# Patient Record
Sex: Female | Born: 1980 | Race: White | Hispanic: No | Marital: Single | Smoking: Former smoker
Health system: Southern US, Community
[De-identification: ages and names within clinical notes are randomized; demographics above are authoritative.]

## PROBLEM LIST (undated history)

## (undated) DIAGNOSIS — F32A Depression, unspecified: Secondary | ICD-10-CM

## (undated) DIAGNOSIS — F988 Other specified behavioral and emotional disorders with onset usually occurring in childhood and adolescence: Secondary | ICD-10-CM

## (undated) DIAGNOSIS — F4310 Post-traumatic stress disorder, unspecified: Secondary | ICD-10-CM

## (undated) DIAGNOSIS — F329 Major depressive disorder, single episode, unspecified: Secondary | ICD-10-CM

## (undated) DIAGNOSIS — F419 Anxiety disorder, unspecified: Secondary | ICD-10-CM

## (undated) HISTORY — DX: Major depressive disorder, single episode, unspecified: F32.9

## (undated) HISTORY — DX: Depression, unspecified: F32.A

## (undated) HISTORY — DX: Anxiety disorder, unspecified: F41.9

## (undated) HISTORY — DX: Post-traumatic stress disorder, unspecified: F43.10

## (undated) HISTORY — DX: Other specified behavioral and emotional disorders with onset usually occurring in childhood and adolescence: F98.8

---

## 2009-10-23 HISTORY — PX: APPENDECTOMY: SHX54

## 2010-05-22 ENCOUNTER — Observation Stay: Payer: Self-pay | Admitting: Surgery

## 2010-05-24 LAB — PATHOLOGY REPORT

## 2014-10-23 HISTORY — PX: LEEP: SHX91

## 2014-12-22 DIAGNOSIS — A4902 Methicillin resistant Staphylococcus aureus infection, unspecified site: Secondary | ICD-10-CM | POA: Insufficient documentation

## 2015-08-24 LAB — HM PAP SMEAR: HM Pap smear: NEGATIVE

## 2016-02-25 ENCOUNTER — Ambulatory Visit (INDEPENDENT_AMBULATORY_CARE_PROVIDER_SITE_OTHER): Payer: BLUE CROSS/BLUE SHIELD | Admitting: Family Medicine

## 2016-02-25 ENCOUNTER — Encounter: Payer: Self-pay | Admitting: Family Medicine

## 2016-02-25 VITALS — BP 105/63 | HR 63 | Temp 98.9°F | Resp 16 | Ht 61.5 in | Wt 131.4 lb

## 2016-02-25 DIAGNOSIS — F329 Major depressive disorder, single episode, unspecified: Secondary | ICD-10-CM

## 2016-02-25 DIAGNOSIS — Z720 Tobacco use: Secondary | ICD-10-CM | POA: Diagnosis not present

## 2016-02-25 DIAGNOSIS — E785 Hyperlipidemia, unspecified: Secondary | ICD-10-CM | POA: Diagnosis not present

## 2016-02-25 DIAGNOSIS — F4310 Post-traumatic stress disorder, unspecified: Secondary | ICD-10-CM

## 2016-02-25 DIAGNOSIS — Z7189 Other specified counseling: Secondary | ICD-10-CM | POA: Diagnosis not present

## 2016-02-25 DIAGNOSIS — Z7689 Persons encountering health services in other specified circumstances: Secondary | ICD-10-CM

## 2016-02-25 DIAGNOSIS — F32A Depression, unspecified: Secondary | ICD-10-CM

## 2016-02-25 DIAGNOSIS — A4902 Methicillin resistant Staphylococcus aureus infection, unspecified site: Secondary | ICD-10-CM

## 2016-02-25 DIAGNOSIS — F909 Attention-deficit hyperactivity disorder, unspecified type: Secondary | ICD-10-CM

## 2016-02-25 DIAGNOSIS — K900 Celiac disease: Secondary | ICD-10-CM

## 2016-02-25 DIAGNOSIS — F988 Other specified behavioral and emotional disorders with onset usually occurring in childhood and adolescence: Secondary | ICD-10-CM

## 2016-02-25 DIAGNOSIS — K9041 Non-celiac gluten sensitivity: Secondary | ICD-10-CM

## 2016-02-25 MED ORDER — BUPROPION HCL ER (XL) 150 MG PO TB24: 150.0000 mg | ORAL_TABLET | Freq: Every day | ORAL | Status: DC

## 2016-02-25 NOTE — Patient Instructions (Addendum)
Let's start Wellbutrin today. Take  once daily.  I recommend taking at GYN.  We will check some labwork for your depression.     Bupropion extended-release tablets (Depression/Mood Disorders) What is this medicine? BUPROPION (byoo PROE pee on) is used to treat depression. This medicine may be used for other purposes; ask your health care provider or pharmacist if you have questions. What should I tell my health care provider before I take this medicine? They need to know if you have any of these conditions: -an eating disorder, such as anorexia or bulimia -bipolar disorder or psychosis -diabetes or high blood sugar, treated with medication -glaucoma -head injury or brain tumor -heart disease, previous heart attack, or irregular heart beat -high blood pressure -kidney or liver disease -seizures (convulsions) -suicidal thoughts or a previous suicide attempt -Tourette's syndrome -weight loss -an unusual or allergic reaction to bupropion, other medicines, foods, dyes, or preservatives -breast-feeding -pregnant or trying to become pregnant How should I use this medicine? Take this medicine by mouth with a glass of water. Follow the directions on the prescription label. You can take it with or without food. If it upsets your stomach, take it with food. Do not crush, chew, or cut these tablets. This medicine is taken once daily at the same time each day. Do not take your medicine more often than directed. Do not stop taking this medicine suddenly except upon the advice of your doctor. Stopping this medicine too quickly may cause serious side effects or your condition may worsen. A special MedGuide will be given to you by the pharmacist with each prescription and refill. Be sure to read this information carefully each time. Talk to your pediatrician regarding the use of this medicine in children. Special care may be needed. Overdosage: If you think you have taken too much of this medicine  contact a poison control center or emergency room at once. NOTE: This medicine is only for you. Do not share this medicine with others. What if I miss a dose? If you miss a dose, skip the missed dose and take your next tablet at the regular time. Do not take double or extra doses. What may interact with this medicine? Do not take this medicine with any of the following medications: -linezolid -MAOIs like Azilect, Carbex, Eldepryl, Marplan, Nardil, and Parnate -methylene blue (injected into a vein) -other medicines that contain bupropion like Zyban This medicine may also interact with the following medications: -alcohol -certain medicines for anxiety or sleep -certain medicines for blood pressure like metoprolol, propranolol -certain medicines for depression or psychotic disturbances -certain medicines for HIV or AIDS like efavirenz, lopinavir, nelfinavir, ritonavir -certain medicines for irregular heart beat like propafenone, flecainide -certain medicines for Parkinson's disease like amantadine, levodopa -certain medicines for seizures like carbamazepine, phenytoin, phenobarbital -cimetidine -clopidogrel -cyclophosphamide -furazolidone -isoniazid -nicotine -orphenadrine -procarbazine -steroid medicines like prednisone or cortisone -stimulant medicines for attention disorders, weight loss, or to stay awake -tamoxifen -theophylline -thiotepa -ticlopidine -tramadol -warfarin This list may not describe all possible interactions. Give your health care provider a list of all the medicines, herbs, non-prescription drugs, or dietary supplements you use. Also tell them if you smoke, drink alcohol, or use illegal drugs. Some items may interact with your medicine. What should I watch for while using this medicine? Tell your doctor if your symptoms do not get better or if they get worse. Visit your doctor or health care professional for regular checks on your progress. Because it may take  several weeks  to see the full effects of this medicine, it is important to continue your treatment as prescribed by your doctor. Patients and their families should watch out for new or worsening thoughts of suicide or depression. Also watch out for sudden changes in feelings such as feeling anxious, agitated, panicky, irritable, hostile, aggressive, impulsive, severely restless, overly excited and hyperactive, or not being able to sleep. If this happens, especially at the beginning of treatment or after a change in dose, call your health care professional. Avoid alcoholic drinks while taking this medicine. Drinking large amounts of alcoholic beverages, using sleeping or anxiety medicines, or quickly stopping the use of these agents while taking this medicine may increase your risk for a seizure. Do not drive or use heavy machinery until you know how this medicine affects you. This medicine can impair your ability to perform these tasks. Do not take this medicine close to bedtime. It may prevent you from sleeping. Your mouth may get dry. Chewing sugarless gum or sucking hard candy, and drinking plenty of water may help. Contact your doctor if the problem does not go away or is severe. The tablet shell for some brands of this medicine does not dissolve. This is normal. The tablet shell may appear whole in the stool. This is not a cause for concern. What side effects may I notice from receiving this medicine? Side effects that you should report to your doctor or health care professional as soon as possible: -allergic reactions like skin rash, itching or hives, swelling of the face, lips, or tongue -breathing problems -changes in vision -confusion -fast or irregular heartbeat -hallucinations -increased blood pressure -redness, blistering, peeling or loosening of the skin, including inside the mouth -seizures -suicidal thoughts or other mood changes -unusually weak or tired -vomiting Side effects  that usually do not require medical attention (report to your doctor or health care professional if they continue or are bothersome): -change in sex drive or performance -constipation -headache -loss of appetite -nausea -tremors -weight loss This list may not describe all possible side effects. Call your doctor for medical advice about side effects. You may report side effects to FDA at 1-800-FDA-1088. Where should I keep my medicine? Keep out of the reach of children. Store at room temperature between 15 and 30 degrees C (59 and 86 degrees F). Throw away any unused medicine after the expiration date. NOTE: This sheet is a summary. It may not cover all possible information. If you have questions about this medicine, talk to your doctor, pharmacist, or health care provider.    2016, Elsevier/Gold Standard. (2013-05-02 12:39:42)
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
## 2016-02-25 NOTE — Assessment & Plan Note (Addendum)
Diagnosed in HS. Pt currently seeking counseling and treatment with [facility] Does not want medications at this time.

## 2016-02-25 NOTE — Assessment & Plan Note (Signed)
Encouraged smoking cessation. Pt would like to try wellbutrin to help.

## 2016-02-25 NOTE — Assessment & Plan Note (Signed)
Check celiac panel. Refer to GI if symptoms persist. Pt currently eating a gluten free diet.

## 2016-02-25 NOTE — Assessment & Plan Note (Signed)
Currently [facility] with [facility].

## 2016-02-25 NOTE — Assessment & Plan Note (Signed)
Pt seeking counseling at this time. Would like to try Wellbutrin. Start 150mg  XL daily. Plan to increase up to 300mg  daily. Check TSH, B12, and vitamin D. Recheck 4 weeks.

## 2016-02-25 NOTE — Assessment & Plan Note (Signed)
Treated by ID at [facility].

## 2016-03-02 ENCOUNTER — Telehealth: Payer: Self-pay | Admitting: Family Medicine

## 2016-03-02 MED ORDER — ACYCLOVIR 800 MG PO TABS: 800.0000 mg | ORAL_TABLET | Freq: Two times a day (BID) | ORAL | Status: DC

## 2016-03-02 NOTE — Telephone Encounter (Signed)
When pt had her appt with Amy on Friday, she failed to discuss cold sores.  She usually take 800 mg acyclovir when she has a one come up.  Does she need a another appt for that?  Her call back number is [phone]

## 2016-03-02 NOTE — Telephone Encounter (Signed)
No appt needed. I will renew the prescription. Sent and pt alerted.

## 2016-03-07 LAB — TSH: TSH: 1.28 u[IU]/mL (ref 0.450–4.500)

## 2016-03-07 LAB — B12 AND FOLATE PANEL
Folate: 11.9 ng/mL (ref >3.0)
Vitamin B-12: 364 pg/mL (ref 211–946)

## 2016-03-07 LAB — COMPREHENSIVE METABOLIC PANEL
A/G RATIO: 1.9 (ref 1.2–2.2)
ALT: 13 IU/L (ref 0–32)
AST: 20 IU/L (ref 0–40)
Albumin: 4.4 g/dL (ref 3.5–5.5)
Alkaline Phosphatase: 55 IU/L (ref 39–117)
BILIRUBIN TOTAL: 0.3 mg/dL (ref 0.0–1.2)
BUN/Creatinine Ratio: 15 (ref 9–23)
BUN: 13 mg/dL (ref 6–20)
CALCIUM: 9.1 mg/dL (ref 8.7–10.2)
CHLORIDE: 102 mmol/L (ref 96–106)
CO2: 22 mmol/L (ref 18–29)
Creatinine, Ser: 0.84 mg/dL (ref 0.57–1.00)
GFR, EST AFRICAN AMERICAN: 105 mL/min/{1.73_m2} (ref >59)
GFR, EST NON AFRICAN AMERICAN: 91 mL/min/{1.73_m2} (ref >59)
GLOBULIN, TOTAL: 2.3 g/dL (ref 1.5–4.5)
Glucose: 82 mg/dL (ref 65–99)
Potassium: 4.7 mmol/L (ref 3.5–5.2)
SODIUM: 139 mmol/L (ref 134–144)
TOTAL PROTEIN: 6.7 g/dL (ref 6.0–8.5)

## 2016-03-07 LAB — LIPID PANEL
CHOL/HDL RATIO: 2.8 ratio (ref 0.0–4.4)
Cholesterol, Total: 166 mg/dL (ref 100–199)
HDL: 59 mg/dL (ref >39)
LDL Calculated: 94 mg/dL (ref 0–99)
Triglycerides: 65 mg/dL (ref 0–149)
VLDL Cholesterol Cal: 13 mg/dL (ref 5–40)

## 2016-03-07 LAB — GLIA (IGA/G) + TTG IGA
ANTIGLIADIN ABS, IGA: 5 U (ref 0–19)
Gliadin IgG: 2 units (ref 0–19)

## 2016-03-07 LAB — VITAMIN D 25 HYDROXY (VIT D DEFICIENCY, FRACTURES): VIT D 25 HYDROXY: 36.4 ng/mL (ref 30.0–100.0)

## 2016-03-10 ENCOUNTER — Other Ambulatory Visit: Payer: Self-pay | Admitting: Family Medicine

## 2016-03-10 DIAGNOSIS — Z91011 Allergy to milk products: Secondary | ICD-10-CM | POA: Insufficient documentation

## 2016-03-10 DIAGNOSIS — K9041 Non-celiac gluten sensitivity: Secondary | ICD-10-CM

## 2016-03-22 ENCOUNTER — Ambulatory Visit (INDEPENDENT_AMBULATORY_CARE_PROVIDER_SITE_OTHER): Payer: BLUE CROSS/BLUE SHIELD | Admitting: Family Medicine

## 2016-03-22 VITALS — BP 100/62 | HR 66 | Temp 98.8°F | Resp 16 | Ht 61.5 in | Wt 131.0 lb

## 2016-03-22 DIAGNOSIS — K602 Anal fissure, unspecified: Secondary | ICD-10-CM | POA: Diagnosis not present

## 2016-03-22 DIAGNOSIS — Z91011 Allergy to milk products: Secondary | ICD-10-CM

## 2016-03-22 DIAGNOSIS — F4310 Post-traumatic stress disorder, unspecified: Secondary | ICD-10-CM | POA: Diagnosis not present

## 2016-03-22 DIAGNOSIS — F32A Depression, unspecified: Secondary | ICD-10-CM

## 2016-03-22 DIAGNOSIS — F329 Major depressive disorder, single episode, unspecified: Secondary | ICD-10-CM

## 2016-03-22 MED ORDER — BUPROPION HCL ER (XL) 300 MG PO TB24: 300.0000 mg | ORAL_TABLET | Freq: Every day | ORAL | Status: DC

## 2016-03-22 NOTE — Progress Notes (Signed)
Subjective:    Patient ID: Tara Grant, female    DOB: 1981-05-11, 35 y.o.   MRN: [id number]   HPI: Tara Grant is a 35 y.o. female presenting on 03/22/2016 for Depression   HPI  Patient here for follow up for depression. Currently taking Welbutrin 150mg . States that she has had a rough several weeks due to her grandmother having back surgery and she is the caregiver. Also states that her counselor is out of town and she broke up with her boyfriend last night. Also feels anxiety over eating because she is having reaction to foods that she has never had trouble with before. Has been taking the medication daily as directed and reports no issues other than dizziness for the past 4-5 days. Denies SI or HI but is having some hopelessness in the mornings when trying to get out of bed. Has trouble sleeping due to dreams and thoughts she is having since taking Welbutrin (vivid dreams).   Was referred  for allergy testing and never received phone call from office to schedule appointment.  [facility] is not accepting new patients at this time.  Pt also reporting issues with anal fissure. Occasional irritation and rectal itching. She has tried increasing fiber but can't due to food allergies. Has tried hemorrhoid cream with mild relief.   Past Medical History  Diagnosis Date  . ADD (attention deficit disorder)   . Depression   . Anxiety   . PTSD (post-traumatic stress disorder)     Current Outpatient Prescriptions on File Prior to Visit  Medication Sig  . acyclovir (ZOVIRAX) 800 MG tablet Take 1 tablet (800 mg total) by mouth 2 (two) times daily. For 5 days during an outbreak.   No current facility-administered medications on file prior to visit.    Review of Systems  Constitutional: Negative for fever, chills, diaphoresis, activity change, appetite change, fatigue and unexpected weight change.  HENT: Negative.  Negative for congestion, ear pain, hearing loss, sinus pressure, sneezing, sore  throat, tinnitus and trouble swallowing.   Eyes: Negative for photophobia, pain, redness and visual disturbance.  Respiratory: Negative for cough, chest tightness and wheezing.   Cardiovascular: Negative for chest pain, palpitations and leg swelling.  Gastrointestinal: Negative for nausea, vomiting, abdominal pain, diarrhea, constipation, blood in stool and abdominal distention.  Endocrine: Negative for polydipsia and polyuria.  Genitourinary: Negative for frequency, flank pain and difficulty urinating.  Musculoskeletal: Negative for myalgias and back pain.  Allergic/Immunologic: Negative for environmental allergies and food allergies.  Neurological: Negative for dizziness, seizures, syncope, weakness, light-headedness, numbness and headaches.  Psychiatric/Behavioral: Positive for sleep disturbance. Negative for confusion and agitation. The patient is nervous/anxious.    Per HPI unless specifically indicated above     Objective:    BP 100/62 mmHg  Pulse 66  Temp(Src) 98.8 F (37.1 C) (Oral)  Resp 16  Ht 5' 1.5" (1.562 m)  Wt 131 lb (59.421 kg)  BMI 24.35 kg/m2  LMP 02/04/2016  Wt Readings from Last 3 Encounters:  03/22/16 131 lb (59.421 kg)  02/25/16 131 lb 6.4 oz (59.603 kg)    Physical Exam  Constitutional: She is oriented to person, place, and time. She appears well-developed and well-nourished.  HENT:  Head: Normocephalic.  Eyes: Pupils are equal, round, and reactive to light.  Neck: Normal range of motion.  Cardiovascular: Normal rate, regular rhythm, normal heart sounds and intact distal pulses.   Pulmonary/Chest: Effort normal and breath sounds normal.  Abdominal: Soft. Bowel sounds are normal.  Musculoskeletal:  Normal range of motion.  Neurological: She is alert and oriented to person, place, and time.  Skin: Skin is warm and dry.  Psychiatric: Her speech is normal and behavior is normal. Judgment and thought content normal. Her mood appears not anxious. Her affect  is not angry and not inappropriate. Thought content is not paranoid. Cognition and memory are normal. She does not exhibit a depressed mood. She expresses no homicidal and no suicidal ideation. She expresses no suicidal plans and no homicidal plans.  Nursing note and vitals reviewed.  Results for orders placed or performed in visit on 02/25/16  HM PAP SMEAR  Result Value Ref Range   HM Pap smear negative   Comprehensive Metabolic Panel (CMET)  Result Value Ref Range   Glucose 82 65 - 99 mg/dL   BUN 13 6 - 20 mg/dL   Creatinine, Ser 9.600.84 0.57 - 1.00 mg/dL   GFR calc non Af Amer 91 >59 mL/min/1.73   GFR calc Af Amer 105 >59 mL/min/1.73   BUN/Creatinine Ratio 15 9 - 23   Sodium 139 134 - 144 mmol/L   Potassium 4.7 3.5 - 5.2 mmol/L   Chloride 102 96 - 106 mmol/L   CO2 22 18 - 29 mmol/L   Calcium 9.1 8.7 - 10.2 mg/dL   Total Protein 6.7 6.0 - 8.5 g/dL   Albumin 4.4 3.5 - 5.5 g/dL   Globulin, Total 2.3 1.5 - 4.5 g/dL   Albumin/Globulin Ratio 1.9 1.2 - 2.2   Bilirubin Total 0.3 0.0 - 1.2 mg/dL   Alkaline Phosphatase 55 39 - 117 IU/L   AST 20 0 - 40 IU/L   ALT 13 0 - 32 IU/L  Lipid Profile  Result Value Ref Range   Cholesterol, Total 166 100 - 199 mg/dL   Triglycerides 65 0 - 149 mg/dL   HDL 59 >45>39 mg/dL   VLDL Cholesterol Cal 13 5 - 40 mg/dL   LDL Calculated 94 0 - 99 mg/dL   Chol/HDL Ratio 2.8 0.0 - 4.4 ratio units  Celiac panel  Result Value Ref Range   Antigliadin Abs, IgA 5 0 - 19 units   Gliadin IgG 2 0 - 19 units   Transglutaminase IgA <2 0 - 3 U/mL  TSH  Result Value Ref Range   TSH 1.280 0.450 - 4.500 uIU/mL  VITAMIN D 25 Hydroxy (Vit-D Deficiency, Fractures)  Result Value Ref Range   Vit D, 25-Hydroxy 36.4 30.0 - 100.0 ng/mL  B12 and Folate Panel  Result Value Ref Range   Vitamin B-12 364 211 - 946 pg/mL   Folate 11.9 >3.0 ng/mL      Assessment & Plan:   Problem List Items Addressed This Visit      Other   Depression - Primary    Doing okay on Wellbutrin.  No appreciable improvement- however she had a stressful few weeks. Willing to try increase to 300mg . Recheck 4 weeks. Consider SSRI vs SNRI if needed.       Relevant Medications   buPROPion (WELLBUTRIN XL) 300 MG 24 hr tablet   PTSD (post-traumatic stress disorder)    Still seeking counseling.       Relevant Medications   buPROPion (WELLBUTRIN XL) 300 MG 24 hr tablet   Dairy allergy    Hiveform reaction with dairy and wheat mixed. Would like allergy testing to determine what she needs to avoid       Other Visit Diagnoses    Anal fissure  Encouraged preventing constipation or non-grain sources of fiber, adding benefiber as needed.        Meds ordered this encounter  Medications  . buPROPion (WELLBUTRIN XL) 300 MG 24 hr tablet    Sig: Take 1 tablet (300 mg total) by mouth daily.    Dispense:  30 tablet    Refill:  11    Order Specific Question:  Supervising Provider    Answer:  Janeann Forehand [phone]      Follow up plan: Return in about 4 weeks (around 04/19/2016) for depression.

## 2016-03-22 NOTE — Assessment & Plan Note (Signed)
Still seeking counseling.

## 2016-03-22 NOTE — Patient Instructions (Signed)
Anal Fissure, Adult °An anal fissure is a small tear or crack in the skin around the anus. Bleeding from a fissure usually stops on its own within a few minutes. However, bleeding will often occur again with each bowel movement until the crack heals. °CAUSES °This condition may be caused by: °· Passing large, hard stool (feces). °· Frequent diarrhea. °· Constipation. °· Inflammatory bowel disease (Crohn disease or ulcerative colitis). °· Infections. °· Anal sex. °SYMPTOMS °Symptoms of this condition include: °· Bleeding from the rectum. °· Small amounts of blood seen on your stool, on toilet paper, or in the toilet after a bowel movement. °· Painful bowel movements. °· Itching or irritation around the anus. °DIAGNOSIS  °A health care provider may diagnose this condition by closely examining the anal area. An anal fissure can usually be seen with careful inspection. In some cases, a rectal exam may be performed, or a short tube (anoscope) may be used to examine the anal canal. °TREATMENT °Treatment for this condition may include: °· Taking steps to avoid constipation. This may include making changes to your diet, such as increasing your intake of fiber or fluid. °· Taking fiber supplements. These supplements can soften your stool to help make bowel movements easier. Your health care provider may also prescribe a stool softener if your stool is often hard. °· Taking sitz baths. This may help to heal the tear. °· Using medicated creams or ointments. These may be prescribed to lessen discomfort. °HOME CARE INSTRUCTIONS °Eating and Drinking °· Avoid foods that may be constipating, such as bananas and dairy products. °· Drink enough fluid to keep your urine clear or pale yellow. °· Maintain a diet that is high in fruits, whole grains, and vegetables. °General Instructions °· Keep the anal area as clean and dry as possible. °· Take sitz baths as told by your health care provider. Do not use soap in the sitz baths. °· Take  over-the-counter and prescription medicines only as told by your health care provider. °· Use creams or ointments only as told by your health care provider. °· Keep all follow-up visits as told by your health care provider. This is important. °SEEK MEDICAL CARE IF: °· You have more bleeding. °· You have a fever. °· You have diarrhea that is mixed with blood. °· You continue to have pain. °· Your problem is getting worse rather than better. °  °This information is not intended to replace advice given to you by your health care provider. Make sure you discuss any questions you have with your health care provider. °  °Document Released: 10/09/2005 Document Revised: 06/30/2015 Document Reviewed: 01/04/2015 °Elsevier Interactive Patient Education ©2016 Elsevier Inc. ° °

## 2016-03-22 NOTE — Assessment & Plan Note (Signed)
Doing okay on Wellbutrin. No appreciable improvement- however she had a stressful few weeks. Willing to try increase to 300mg . Recheck 4 weeks. Consider SSRI vs SNRI if needed.

## 2016-03-22 NOTE — Assessment & Plan Note (Signed)
Hiveform reaction with dairy and wheat mixed. Would like allergy testing to determine what she needs to avoid

## 2016-04-11 ENCOUNTER — Telehealth: Payer: Self-pay | Admitting: Family Medicine

## 2016-04-11 NOTE — Telephone Encounter (Signed)
Called patient to determine if she stilled wanted allergy referral. They contacted her x 2 to schedule an appt. LMTCB.

## 2017-03-13 ENCOUNTER — Encounter: Payer: Self-pay | Admitting: Nurse Practitioner

## 2017-03-13 ENCOUNTER — Ambulatory Visit (INDEPENDENT_AMBULATORY_CARE_PROVIDER_SITE_OTHER): Payer: BLUE CROSS/BLUE SHIELD | Admitting: Nurse Practitioner

## 2017-03-13 VITALS — BP 108/71 | HR 76 | Ht 61.0 in | Wt 123.0 lb

## 2017-03-13 DIAGNOSIS — F4310 Post-traumatic stress disorder, unspecified: Secondary | ICD-10-CM | POA: Diagnosis not present

## 2017-03-13 DIAGNOSIS — J301 Allergic rhinitis due to pollen: Secondary | ICD-10-CM | POA: Diagnosis not present

## 2017-03-13 DIAGNOSIS — F419 Anxiety disorder, unspecified: Secondary | ICD-10-CM | POA: Diagnosis not present

## 2017-03-13 DIAGNOSIS — F321 Major depressive disorder, single episode, moderate: Secondary | ICD-10-CM

## 2017-03-13 DIAGNOSIS — R141 Gas pain: Secondary | ICD-10-CM

## 2017-03-13 MED ORDER — BUPROPION HCL ER (XL) 300 MG PO TB24: 300.0000 mg | ORAL_TABLET | Freq: Every day | ORAL | 11 refills | Status: DC

## 2017-03-13 MED ORDER — BUSPIRONE HCL 5 MG PO TABS: 5.0000 mg | ORAL_TABLET | Freq: Three times a day (TID) | ORAL | 2 refills | Status: DC

## 2017-03-13 NOTE — Patient Instructions (Addendum)
Tara Grant, Thank you for coming in to clinic today.  1. Continue your therapy with Tara BraunKaren. 2. Continue your wellbutrin XL 300 mg once daily. 3. START buspar 5 mg once daily for 1 week. Increase to 5 mg 2 x daily, then 3 x daily if needed for anxiety.  Let me know if this is helping and not fully effective because we can go up.  For your allergies: - Continue your 12-hr Claritin, sudafed as needed.  For your gas: - Work on excluding dairy for the next 2-4 weeks. - Increase dietary fiber and water intake after 2 weeks of your dairy exclusion. - If symptoms persist with management of your anxiety and depression, we can consider other GI problems as a cause of your symptoms.  Please schedule a follow-up appointment with Tara Grant, AGNP to Return in about 4 weeks (around 04/10/2017) for med change - buspar.   If you have any other questions or concerns, please feel free to call the clinic or send a message through MyChart. You may also schedule an earlier appointment if necessary.  Tara McardleLauren Glanda Spanbauer, DNP, AGNP-BC Adult Gerontology Nurse Practitioner [facility], [facility]   Buspirone tablets What is this medicine? BUSPIRONE (byoo SPYE rone) is used to treat anxiety disorders. This medicine may be used for other purposes; ask your health care provider or pharmacist if you have questions. COMMON BRAND NAME(S): BuSpar What should I tell my health care provider before I take this medicine? They need to know if you have any of these conditions: -kidney or liver disease -an unusual or allergic reaction to buspirone, other medicines, foods, dyes, or preservatives -pregnant or trying to get pregnant -breast-feeding How should I use this medicine? Take this medicine by mouth with a glass of water. Follow the directions on the prescription label. You may take this medicine with or without food. To ensure that this medicine always works the same way for you, you should take it either  always with or always without food. Take your doses at regular intervals. Do not take your medicine more often than directed. Do not stop taking except on the advice of your doctor or health care professional. Talk to your pediatrician regarding the use of this medicine in children. Special care may be needed. Overdosage: If you think you have taken too much of this medicine contact a poison control center or emergency room at once. NOTE: This medicine is only for you. Do not share this medicine with others. What if I miss a dose? If you miss a dose, take it as soon as you can. If it is almost time for your next dose, take only that dose. Do not take double or extra doses. What may interact with this medicine? Do not take this medicine with any of the following medications: -linezolid -MAOIs like Carbex, Eldepryl, Marplan, Nardil, and Parnate -methylene blue -procarbazine This medicine may also interact with the following medications: -diazepam -digoxin -diltiazem -erythromycin -grapefruit juice -haloperidol -medicines for mental depression or mood problems -medicines for seizures like carbamazepine, phenobarbital and phenytoin -nefazodone -other medications for anxiety -rifampin -ritonavir -some antifungal medicines like itraconazole, ketoconazole, and voriconazole -verapamil -warfarin This list may not describe all possible interactions. Give your health care provider a list of all the medicines, herbs, non-prescription drugs, or dietary supplements you use. Also tell them if you smoke, drink alcohol, or use illegal drugs. Some items may interact with your medicine. What should I watch for while using this medicine? Visit your doctor  or health care professional for regular checks on your progress. It may take 1 to 2 weeks before your anxiety gets better. You may get drowsy or dizzy. Do not drive, use machinery, or do anything that needs mental alertness until you know how this drug  affects you. Do not stand or sit up quickly, especially if you are an older patient. This reduces the risk of dizzy or fainting spells. Alcohol can make you more drowsy and dizzy. Avoid alcoholic drinks. What side effects may I notice from receiving this medicine? Side effects that you should report to your doctor or health care professional as soon as possible: -blurred vision or other vision changes -chest pain -confusion -difficulty breathing -feelings of hostility or anger -muscle aches and pains -numbness or tingling in hands or feet -ringing in the ears -skin rash and itching -vomiting -weakness Side effects that usually do not require medical attention (report to your doctor or health care professional if they continue or are bothersome): -disturbed dreams, nightmares -headache -nausea -restlessness or nervousness -sore throat and nasal congestion -stomach upset This list may not describe all possible side effects. Call your doctor for medical advice about side effects. You may report side effects to FDA at 1-800-FDA-1088. Where should I keep my medicine? Keep out of the reach of children. Store at room temperature below 30 degrees C (86 degrees F). Protect from light. Keep container tightly closed. Throw away any unused medicine after the expiration date. NOTE: This sheet is a summary. It may not cover all possible information. If you have questions about this medicine, talk to your doctor, pharmacist, or health care provider.  2018 Elsevier/Gold Standard (2010-05-19 18:06:11)
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
## 2017-03-14 DIAGNOSIS — J301 Allergic rhinitis due to pollen: Secondary | ICD-10-CM | POA: Insufficient documentation

## 2017-03-15 NOTE — Progress Notes (Addendum)
I have reviewed this encounter including the documentation in this note and/or discussed this patient with the provider, Wilhelmina McardleLauren Kennedy, AGPCNP-BC. I am certifying that I agree with the content of this note as supervising physician.  Saralyn PilarAlexander Jerolene Kupfer, DO [facility] 03/15/2017, 8:14 AM

## 2017-04-11 ENCOUNTER — Ambulatory Visit (INDEPENDENT_AMBULATORY_CARE_PROVIDER_SITE_OTHER): Payer: BLUE CROSS/BLUE SHIELD | Admitting: Nurse Practitioner

## 2017-04-11 ENCOUNTER — Encounter: Payer: Self-pay | Admitting: Nurse Practitioner

## 2017-04-11 VITALS — BP 88/62 | HR 66 | Ht 61.0 in | Wt 124.0 lb

## 2017-04-11 DIAGNOSIS — F419 Anxiety disorder, unspecified: Secondary | ICD-10-CM

## 2017-04-11 DIAGNOSIS — F321 Major depressive disorder, single episode, moderate: Secondary | ICD-10-CM

## 2017-04-11 DIAGNOSIS — I9589 Other hypotension: Secondary | ICD-10-CM

## 2017-04-11 MED ORDER — BUSPIRONE HCL 5 MG PO TABS: 5.0000 mg | ORAL_TABLET | Freq: Two times a day (BID) | ORAL | 2 refills | Status: DC

## 2017-04-11 NOTE — Patient Instructions (Addendum)
Synetta Failnita, Thank you for coming in to clinic today.  1. Continue your counseling.  2. Continue your wellbutrin XL 300 mg once daily.  3. Continue your buspar 5 mg once daily. Use twice daily as needed.  4. Call if you need to schedule an annual physical.  Please schedule a follow-up appointment with Wilhelmina McardleLauren Trinidad Ingle, AGNP to Return in about 6 months (around 10/11/2017) for anxiety.  If you have any other questions or concerns, please feel free to call the clinic or send a message through MyChart. You may also schedule an earlier appointment if necessary.  Wilhelmina McardleLauren Shalin Vonbargen, DNP, AGNP-BC Adult Gerontology Nurse Practitioner [facility], [facility]

## 2017-04-11 NOTE — Progress Notes (Signed)
Subjective:    Patient ID: Tara Grant, female    DOB: 21-Mar-1981, 36 y.o.   MRN: [id number]  Tara Grant is a 36 y.o. female presenting on 04/11/2017 for Medication Management   HPI Depression and Anxiety At last visit added buspar 5 mg once daily up to tid if needed. Pt still taking wellbutrin XR 300 mg once daily.  Last visit w/ Karen [place] 7 days ago.  EMDR therapy in process.  [facility] for addictions.  Al-anon meetings.  Current symptoms:  Noticed a "tremendous, physical difference".  Fewer nervous moments that come and go quickly.  Increased meditation.  Fewer self-deprecating thoughts.  No panic attacks since about 1 week after last visit.  Denies nervousness at night.  Still roller coaster emotions, but better.    Relationship support: boyfriend stress is lower, comes and goes.  Has set firm boundaries.  Low BP: Denies dizziness, lightheadedness, ate breakfast, drinking coffee.  Possibly not drinking enough water.  No recent alcohol intake.  [facility] 2/9 04/11/2017 03/13/2017 03/22/2016 02/25/2016 02/25/2016  Decreased Interest 1 2 2 3 3   Down, Depressed, Hopeless 1 1 2 2 2   PHQ - 2 Score 2 3 4 5 5   Altered sleeping 1 1 0 3 0  Tired, decreased energy 1 2 3 2 3   Change in appetite 2 1 1 3 2   Feeling bad or failure about yourself  2 3 3 3 3   Trouble concentrating 3 3 3  - 3  Moving slowly or fidgety/restless 1 3 1 3 3   Suicidal thoughts 0 0 0 0 0  PHQ-9 Score 12 16 15 19 19   Difficult doing work/chores - - Somewhat difficult - -   GAD 7 : Generalized Anxiety Score 04/11/2017 03/13/2017 02/25/2016  Nervous, Anxious, on Edge 1 3 3   Control/stop worrying 1 3 3   Worry too much - different things 1 3 3   Trouble relaxing 2 3 3   Restless 1 3 3   Easily annoyed or irritable 2 3 1   Afraid - awful might happen 0 1 0  Total GAD 7 Score 8 19 16   Anxiety Difficulty - Somewhat difficult Not difficult at all     Social History  Substance Use Topics  . Smoking status: Former  Smoker    Types: Cigarettes    Quit date: 01/11/2017  . Smokeless tobacco: Never Used  . Alcohol use No    Review of Systems  Per HPI unless specifically indicated above     Objective:    BP (!) 88/62   Pulse 66   Ht 5\' 1"  (1.549 m)   Wt 124 lb (56.2 kg)   BMI 23.43 kg/m    88/62 BP manual recheck  Wt Readings from Last 3 Encounters:  04/11/17 124 lb (56.2 kg)  03/13/17 123 lb (55.8 kg)  03/22/16 131 lb (59.4 kg)    Physical Exam  Constitutional: She is oriented to person, place, and time. She appears well-developed and well-nourished. No distress.  HENT:  Head: Normocephalic and atraumatic.  Cardiovascular: Normal rate, regular rhythm and normal heart sounds.   Neurological: She is alert and oriented to person, place, and time. She has normal reflexes. No cranial nerve deficit. Coordination normal.  Skin: Skin is warm and dry.  Psychiatric: She has a normal mood and affect. Her behavior is normal. Judgment and thought content normal.  Vitals reviewed.       Assessment & Plan:   Problem List Items Addressed This Visit  Other   Depression    Doing well on Wellbutrin XL 300 mg once daily.  Moderate improvement on PHQ -9 since last visit.   Plan: 1. Continue counseling. 2. Continue Wellbutrin XL 300 mg once daily. 3. Follow up 6 months or sooner if needed.      Relevant Medications   busPIRone (BUSPAR) 5 MG tablet   Anxiety    Significant symptom improvement since last visit with fewer panic attacks and better ability to move forward from stress.  Reduced physical symptoms noted by pt.  GAD7 reduced by > 50%  Plan: 1. Continue buspar 5 mg once daily w/ option to increase to twice daily. 2. Continue wellbutrin XL 300 mg once daily. 3. Follow up 6 months or sooner if needed.      Relevant Medications   busPIRone (BUSPAR) 5 MG tablet    Other Visit Diagnoses    Other specified hypotension    -  Primary   Mild hypotension.  Possible dehydration. Pt  asymptomatic.   Plan: 1. Encourage hydration.   2. Follow up if symptomatic.  Monitor at next visit.      Meds ordered this encounter  Medications  . MULTIPLE VITAMIN PO    Sig: Take by mouth.  . busPIRone (BUSPAR) 5 MG tablet    Sig: Take 1 tablet (5 mg total) by mouth 2 (two) times daily.    Dispense:  180 tablet    Refill:  2      Follow up plan: Return in about 6 months (around 10/11/2017) for anxiety.   Wilhelmina Mcardle, DNP, AGPCNP-BC Adult Gerontology Primary Care Nurse Practitioner [facility] 04/11/2017, 6:10 PM

## 2017-04-11 NOTE — Assessment & Plan Note (Signed)
Significant symptom improvement since last visit with fewer panic attacks and better ability to move forward from stress.  Reduced physical symptoms noted by pt.  GAD7 reduced by > 50%  Plan: 1. Continue buspar 5 mg once daily w/ option to increase to twice daily. 2. Continue wellbutrin XL 300 mg once daily. 3. Follow up 6 months or sooner if needed.

## 2017-04-11 NOTE — Assessment & Plan Note (Signed)
Doing well on Wellbutrin XL 300 mg once daily.  Moderate improvement on PHQ -9 since last visit.   Plan: 1. Continue counseling. 2. Continue Wellbutrin XL 300 mg once daily. 3. Follow up 6 months or sooner if needed.

## 2017-04-16 NOTE — Progress Notes (Signed)
I have reviewed this encounter including the documentation in this note and/or discussed this patient with the provider, Lauren Kennedy, AGPCNP-BC. I am certifying that I agree with the content of this note as supervising physician.  Tomothy Eddins, DO [facility] 04/16/2017, 6:29 PM 

## 2017-05-21 ENCOUNTER — Encounter: Payer: Self-pay | Admitting: Podiatry

## 2017-05-21 ENCOUNTER — Ambulatory Visit (INDEPENDENT_AMBULATORY_CARE_PROVIDER_SITE_OTHER): Payer: BLUE CROSS/BLUE SHIELD

## 2017-05-21 ENCOUNTER — Ambulatory Visit (INDEPENDENT_AMBULATORY_CARE_PROVIDER_SITE_OTHER): Payer: BLUE CROSS/BLUE SHIELD | Admitting: Podiatry

## 2017-05-21 DIAGNOSIS — M779 Enthesopathy, unspecified: Secondary | ICD-10-CM

## 2017-05-21 DIAGNOSIS — S93492A Sprain of other ligament of left ankle, initial encounter: Secondary | ICD-10-CM

## 2017-05-21 MED ORDER — MELOXICAM 15 MG PO TABS: 15.0000 mg | ORAL_TABLET | Freq: Every day | ORAL | 3 refills | Status: DC

## 2017-05-21 NOTE — Progress Notes (Signed)
   Subjective:    Patient ID: Robert BellowAnita Savary, female    DOB: May 28, 1981, 36 y.o.   MRN: [id number]  HPI: She presents today chief complaint of pain to the anterolateral aspect of the left ankle. States that she injured the ankle 2 years ago she fell over top of the ankle while hiking. She states the foot completely rolled back up underneath her much like a severe ankle sprain. She states that ever since she fell it hasn't felt right since. She states that she gets sharp shooting pains to grab her. She states that these are intermittent but some aren't tense. She states that she has sought no attention for this ankle injury.    Review of Systems  All other systems reviewed and are negative.      Objective:   Physical Exam: Vital signs are stable alert and oriented 3. Pulses are palpable. Neurologic sensorium is intact. Deep tendon reflexes are brisk and symmetrical bilateral. Orthopedic evaluation and shows all joints distal to the ankle for range of motion or crepitation she has pain on palpation of the anterolateral ankle and of the talar dome. She also has tenderness of the extensor digitorum longus tendon slip at the level of the ankle area there appears to be some fibrosis or scarring beneath this. 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 Capsulitis.  Plan: I injected the area today and start her on meloxicam.

## 2017-06-18 ENCOUNTER — Encounter: Payer: Self-pay | Admitting: Podiatry

## 2017-06-18 ENCOUNTER — Ambulatory Visit (INDEPENDENT_AMBULATORY_CARE_PROVIDER_SITE_OTHER): Payer: BLUE CROSS/BLUE SHIELD | Admitting: Podiatry

## 2017-06-18 DIAGNOSIS — M722 Plantar fascial fibromatosis: Secondary | ICD-10-CM

## 2017-06-18 DIAGNOSIS — S93492D Sprain of other ligament of left ankle, subsequent encounter: Secondary | ICD-10-CM | POA: Diagnosis not present

## 2017-06-18 NOTE — Patient Instructions (Signed)

## 2017-06-18 NOTE — Progress Notes (Signed)
Tara Grant presents today for follow-up of her dorsal lateral aspect of her left foot and capsulitis were we gave her an injection last time. Also put her on oral anti-inflammatories which she did not take. She states that now she started to develop some pain along the medial aspect of the foot and plantar medial heel left.  Objective: Vital signs are stable she is alert and oriented 3 she has pain on palpation appointment maximal tenderness of the left heel. She also has tenderness on palpation of the  posterior tibial tendon and the plantar fascia.  Assessment: Resolving left lateral ankle pain development of plantar fasciitis and tendinitis.  Plan: Injected her left heel today with Kenalog and local anesthetic plantarly discussed the use of oral anti-inflammatories which she does not want to do. I will follow up with her in the near future. We also dispensed a plantar fascia brace.

## 2017-07-09 ENCOUNTER — Ambulatory Visit (INDEPENDENT_AMBULATORY_CARE_PROVIDER_SITE_OTHER): Payer: BLUE CROSS/BLUE SHIELD | Admitting: Nurse Practitioner

## 2017-07-09 ENCOUNTER — Encounter: Payer: Self-pay | Admitting: Nurse Practitioner

## 2017-07-09 VITALS — BP 103/66 | Temp 98.7°F | Ht 61.0 in | Wt 123.4 lb

## 2017-07-09 DIAGNOSIS — Z8614 Personal history of Methicillin resistant Staphylococcus aureus infection: Secondary | ICD-10-CM | POA: Diagnosis not present

## 2017-07-09 DIAGNOSIS — L723 Sebaceous cyst: Secondary | ICD-10-CM

## 2017-07-09 MED ORDER — MUPIROCIN 2 % EX OINT: 1.0000 "application " | TOPICAL_OINTMENT | Freq: Two times a day (BID) | CUTANEOUS | 0 refills | Status: DC

## 2017-07-09 NOTE — Patient Instructions (Addendum)
Tara Grant, Thank you for coming in to clinic today.  1. For your abscess: - This is likely a sebaceous cyst that is infected. - Use mupirocin ointment on this blister. - If not improving in the next 2-5 days, call clinic for antibiotics.   Please schedule a follow-up appointment with Wilhelmina Mcardle, AGNP. Return 2-5 days if symptoms worsen or fail to improve.  If you have any other questions or concerns, please feel free to call the clinic or send a message through MyChart. You may also schedule an earlier appointment if necessary.  You will receive a survey after today's visit either digitally by e-mail or paper by [employer]. Your experiences and feedback matter to [place].  Please respond so we know how we are doing as we provide care for you.   Wilhelmina Mcardle, DNP, AGNP-BC Adult Gerontology Nurse Practitioner [facility], [facility]     Epidermal Cyst An epidermal cyst is sometimes called an epidermal inclusion cyst or an infundibular cyst. It is a sac made of skin tissue. The sac contains a substance called keratin. Keratin is a protein that is normally secreted through the hair follicles. When keratin becomes trapped in the top layer of skin (epidermis), it can form an epidermal cyst. Epidermal cysts are usually found on the face, neck, trunk, and genitals. These cysts are usually harmless (benign), and they may not cause symptoms unless they become infected. It is important not to pop epidermal cysts yourself. What are the causes? This condition may be caused by:  A blocked hair follicle.  A hair that curls and re-enters the skin instead of growing straight out of the skin (ingrown hair).  A blocked pore.  Irritated skin.  An injury to the skin.  Certain conditions that are passed along from parent to child (inherited).  Human papillomavirus (HPV).  What increases the risk? The following factors may make you more likely to develop an epidermal cyst:  Having  acne.  Being overweight.  Wearing tight clothing.  What are the signs or symptoms? The only symptom of this condition may be a small, painless lump underneath the skin. When an epidermal cyst becomes infected, symptoms may include:  Redness.  Inflammation.  Tenderness.  Warmth.  Fever.  Keratin draining from the cyst. Keratin may look like a grayish-white, bad-smelling substance.  Pus draining from the cyst.  How is this diagnosed? This condition is diagnosed with a physical exam. In some cases, you may have a sample of tissue (biopsy) taken from your cyst to be examined under a microscope or tested for bacteria. You may be referred to a health care provider who specializes in skin care (dermatologist). How is this treated? In many cases, epidermal cysts go away on their own without treatment. If a cyst becomes infected, treatment may include:  Opening and draining the cyst. After draining, minor surgery to remove the rest of the cyst may be done.  Antibiotic medicine to help prevent infection.  Injections of medicines (steroids) that help to reduce inflammation.  Surgery to remove the cyst. Surgery may be done if: ? The cyst becomes large. ? The cyst bothers you. ? There is a chance that the cyst could turn into cancer.  Follow these instructions at home:  Take over-the-counter and prescription medicines only as told by your health care provider.  If you were prescribed an antibiotic, use it as told by your health care provider. Do not stop using the antibiotic even if you start to  feel better.  Keep the area around your cyst clean and dry.  Wear loose, dry clothing.  Do not try to pop your cyst.  Avoid touching your cyst.  Check your cyst every day for signs of infection.  Keep all follow-up visits as told by your health care provider. This is important. How is this prevented?  Wear clean, dry, clothing.  Avoid wearing tight clothing.  Keep your skin  clean and dry. Shower or take baths every day.  Wash your body with a benzoyl peroxide wash when you shower or bathe. Contact a health care provider if:  Your cyst develops symptoms of infection.  Your condition is not improving or is getting worse.  You develop a cyst that looks different from other cysts you have had.  You have a fever. Get help right away if:  Redness spreads from the cyst into the surrounding area. This information is not intended to replace advice given to you by your health care provider. Make sure you discuss any questions you have with your health care provider. Document Released: 09/09/2004 Document Revised: 06/07/2016 Document Reviewed: 08/11/2015 Elsevier Interactive Patient Education  [employer].

## 2017-07-09 NOTE — Progress Notes (Signed)
Subjective:    Patient ID: Tara Grant, female    DOB: 03/30/1981, 36 y.o.   MRN: [id number]  Tara Grant is a 36 y.o. female presenting on 07/09/2017 for Recurrent Skin Infections (w/ history of MRSA. On the inner thigh on the Right side. )   HPI Boil R perineum Has had these repeatedly in past and usually will resolve w/o systemic antibiotics and only using mupirocin ointment.  Has not had all boils tested in past, but one has tested positive for MRSA.  Pt states this abscess looks same as those in past that have.  - Applied 2 hot compresses last night.  Not currently draining. - Started only under the skin, now is coming to the surface. - Has used mupirocin ointment for prior abscesses, but has not applied any for this occurrence.  Denies fever, chills, sweats.    Social History  Substance Use Topics  . Smoking status: Former Smoker    Types: Cigarettes    Quit date: 01/11/2017  . Smokeless tobacco: Never Used  . Alcohol use No    Review of Systems Per HPI unless specifically indicated above     Objective:    BP 103/66 (BP Location: Right Arm, Patient Position: Sitting, Cuff Size: Normal)   Temp 98.7 F (37.1 C) (Oral)   Ht  (1.549 m)   Wt 123 lb 6.4 oz (56 kg)   SpO2 (!) 60%   BMI 23.32 kg/m   Wt Readings from Last 3 Encounters:  07/09/17 123 lb 6.4 oz (56 kg)  04/11/17 124 lb (56.2 kg)  03/13/17 123 lb (55.8 kg)    Physical Exam  Constitutional: She is oriented to person, place, and time. She appears well-developed and well-nourished. No distress.  HENT:  Head: Normocephalic and atraumatic.  Cardiovascular: Normal rate, regular rhythm, normal heart sounds and intact distal pulses.   Pulmonary/Chest: Effort normal and breath sounds normal.  Abdominal: Soft. Bowel sounds are normal.  Genitourinary:     Neurological: She is alert and oriented to person, place, and time.  Skin: Skin is warm and dry. Lesion noted.  Fluid filled blister on skin directly  over 2 inch round induration w/ mild erythema no surrounding edema.  No inguinal LAD.  Psychiatric: She has a normal mood and affect. Her behavior is normal. Judgment and thought content normal.     Results for orders placed or performed in visit on 02/25/16  HM PAP SMEAR  Result Value Ref Range   HM Pap smear negative   Comprehensive Metabolic Panel (CMET)  Result Value Ref Range   Glucose 82 65 - 99 mg/dL   BUN 13 6 - 20 mg/dL   Creatinine, Ser 4.09 0.57 - 1.00 mg/dL   GFR calc non Af Amer 91 >59 mL/min/1.73   GFR calc Af Amer 105 >59 mL/min/1.73   BUN/Creatinine Ratio 15 9 - 23   Sodium 139 134 - 144 mmol/L   Potassium 4.7 3.5 - 5.2 mmol/L   Chloride 102 96 - 106 mmol/L   CO2 22 18 - 29 mmol/L   Calcium 9.1 8.7 - 10.2 mg/dL   Total Protein 6.7 6.0 - 8.5 g/dL   Albumin 4.4 3.5 - 5.5 g/dL   Globulin, Total 2.3 1.5 - 4.5 g/dL   Albumin/Globulin Ratio 1.9 1.2 - 2.2   Bilirubin Total 0.3 0.0 - 1.2 mg/dL   Alkaline Phosphatase 55 39 - 117 IU/L   AST 20 0 - 40 IU/L   ALT 13 0 -  32 IU/L  Lipid Profile  Result Value Ref Range   Cholesterol, Total 166 100 - 199 mg/dL   Triglycerides 65 0 - 149 mg/dL   HDL 59 >16 mg/dL   VLDL Cholesterol Cal 13 5 - 40 mg/dL   LDL Calculated 94 0 - 99 mg/dL   Chol/HDL Ratio 2.8 0.0 - 4.4 ratio units  Celiac panel  Result Value Ref Range   Antigliadin Abs, IgA 5 0 - 19 units   Gliadin IgG 2 0 - 19 units   Transglutaminase IgA <2 0 - 3 U/mL  TSH  Result Value Ref Range   TSH 1.280 0.450 - 4.500 uIU/mL  VITAMIN D 25 Hydroxy (Vit-D Deficiency, Fractures)  Result Value Ref Range   Vit D, 25-Hydroxy 36.4 30.0 - 100.0 ng/mL  B12 and Folate Panel  Result Value Ref Range   Vitamin B-12 364 211 - 946 pg/mL   Folate 11.9 >3.0 ng/mL      Assessment & Plan:   Problem List Items Addressed This Visit    None    Visit Diagnoses    Sebaceous cyst    -  Primary Acute sebaceous cyst w/ infection located in R perineum at gluteal fold.  Blister  overlying indurated area below skin.  No purulence.  Plan: 1. Pt notes she would like to have culture of fluid.  Described risks of opening blister and possible infection.  Pt agrees.  Fluid aspiration attempted, but blister did not remain intact.  Blister filled w/ purulent serous fluid. Wound culture collected. 2. Apply mupirocin ointment 2 x daily x 7 days. 3. Call clinic if no improvement in next 2-3 days.  Discussed antibiotic treatment.  Pt declines at this time.  Amoxicillin and Bactrim allergies will make treatment choice more difficult.  Consider doxycycline vs. cephalexin if no improvement.  Reviewed s/sx of when to call clinic and follow up. 4. Follow up 2-5 days as needed.   Relevant Orders   Wound culture   Hx MRSA infection     See above. - Consider chlorhexidine bathing and mupirocin nasal ointment x 5 days if wound culture MRSA positive.   Relevant Orders   Wound culture      Meds ordered this encounter  Medications  . Saccharomyces boulardii (PROBIOTIC) 250 MG CAPS    Sig: Take by mouth.  . mupirocin ointment (BACTROBAN) 2 %    Sig: Place 1 application into the nose 2 (two) times daily.    Dispense:  22 g    Refill:  0      Follow up plan: Return 2-5 days if symptoms worsen or fail to improve.   Wilhelmina Mcardle, DNP, AGPCNP-BC Adult Gerontology Primary Care Nurse Practitioner [facility] 07/09/2017, 12:45 PM

## 2017-07-12 LAB — WOUND CULTURE
MICRO NUMBER:: 81023955
SPECIMEN QUALITY:: ADEQUATE

## 2017-10-08 ENCOUNTER — Ambulatory Visit: Payer: BLUE CROSS/BLUE SHIELD | Admitting: Podiatry

## 2017-10-08 ENCOUNTER — Encounter: Payer: Self-pay | Admitting: Podiatry

## 2017-10-08 DIAGNOSIS — M722 Plantar fascial fibromatosis: Secondary | ICD-10-CM | POA: Diagnosis not present

## 2017-10-08 DIAGNOSIS — S93492D Sprain of other ligament of left ankle, subsequent encounter: Secondary | ICD-10-CM

## 2017-10-08 DIAGNOSIS — M779 Enthesopathy, unspecified: Secondary | ICD-10-CM | POA: Diagnosis not present

## 2017-10-08 NOTE — Progress Notes (Signed)
She presents today for follow-up of plantar fasciitis and tendinitis.  She still exquisitely tender she states overlying the anterolateral aspect of the left foot.  Left heel is killing her.  Objective: Vital signs are stable she is alert and oriented x3 conservative therapies are not assisting her.  Pulses remain palpable left.  She still has pain on palpation at anterior talofibular ligament with radiating pain up the anterior aspect of the shin upon palpation of this area.  She also has pain on palpation medial plantar tubercle of the left heel.  Assessment: Chronic ankle sprain which is more than likely unstable left.  Fasciitis left.  Plan: I recommended MRI today however she does not want to do this at this point.  So I reinjected the anterior ankle dexamethasone the left heel with Kenalog and local anesthetic after sterile Betadine skin prep to both of these areas.  She also provided verbal confirmation and consent.  She tolerated procedure well without complications.  Placed her in a short Cam walker and will follow up with her in 1-2 months.

## 2017-11-05 ENCOUNTER — Ambulatory Visit: Payer: BLUE CROSS/BLUE SHIELD | Admitting: Podiatry

## 2017-11-05 ENCOUNTER — Encounter: Payer: Self-pay | Admitting: Podiatry

## 2017-11-05 DIAGNOSIS — M722 Plantar fascial fibromatosis: Secondary | ICD-10-CM

## 2017-11-05 DIAGNOSIS — S93492D Sprain of other ligament of left ankle, subsequent encounter: Secondary | ICD-10-CM | POA: Diagnosis not present

## 2017-11-05 NOTE — Progress Notes (Signed)
She presents today for follow-up of plantar fasciitis and tendinitis.  She states that is fine with me as long as I wear the boot.  She states that she does not have any pain until she is without the boot.  She states that the foot still feels very weak.  She refers to the anterior lateral ankle.  Objective: Vital signs are stable she is alert and oriented x3.  Pulses are palpable.  She has pain on palpation to this area today overlying the anterior talofibular ligament and the medial aspect of the deltoids.  There is no anterior drawer sign.  Assessment: Cannot rule out a total rupture of the anterior talofibular ligament of her left ankle.  I am highly suspicious of this and and OCD.  Plan: At this point am requesting ankle MRI to evaluate better the anterior talofibular ligament and osteochondral defects.  This is to be surgical indication.  Also dispensed a Tri-Lock brace today for stabilization.

## 2017-11-15 ENCOUNTER — Telehealth: Payer: Self-pay

## 2017-11-15 NOTE — Telephone Encounter (Signed)
Per Inetta Fermoina L. With [employer], MRI has been approved from 11/15/17 to 12/14/17  Auth # [id number]   Patient has been notified of approval via voice mail and informed to call and schedule own appt

## 2017-11-15 NOTE — Telephone Encounter (Signed)
-----   Message from Kristian CoveyAshley E Prevette, [facility] sent at 11/05/2017  9:03 AM EST ----- Regarding: MRI  MRI left ankle - ATFL tear left, conservative treatment failed-surgical consideration

## 2017-11-15 NOTE — Addendum Note (Signed)
Addended by: Geraldine ContrasVENABLE, ANGELA D on: 11/15/2017 08:52 AM   Modules accepted: Orders

## 2017-11-22 ENCOUNTER — Telehealth: Payer: Self-pay

## 2017-11-22 NOTE — Telephone Encounter (Signed)
This is very difficult to diagnose over phone call.  Pt will need to have a visit in clinic either tomorrow, or in urgent care today if symptoms continue.

## 2017-11-22 NOTE — Telephone Encounter (Signed)
Pt call complaining of 2 episodes once last night and again today her eyes started twitching, dizziness, and pressure behind the eyes that lasted about 5 minutes and subsided. The episode today happen while she was driven.  She admits she have been battling a cold for about a week.

## 2017-11-22 NOTE — Telephone Encounter (Signed)
Attempted to conact the pt, no answer. LMOM to return my call on tomorrow.

## 2017-11-23 NOTE — Telephone Encounter (Signed)
Attempted to contact the pt again, no answer.  

## 2017-11-23 NOTE — Telephone Encounter (Signed)
I the pt declined coming in the office today. She feel like her symptoms are related to her cold symptoms. She will be leaving today to go out of town and will call us when she gets back to schedule an appt. I informed the patient if her symptoms return to seek medical care. She verbalize understanding.

## 2017-11-28 ENCOUNTER — Ambulatory Visit (INDEPENDENT_AMBULATORY_CARE_PROVIDER_SITE_OTHER): Payer: BLUE CROSS/BLUE SHIELD | Admitting: Nurse Practitioner

## 2017-11-28 ENCOUNTER — Encounter: Payer: Self-pay | Admitting: Nurse Practitioner

## 2017-11-28 ENCOUNTER — Other Ambulatory Visit: Payer: Self-pay

## 2017-11-28 VITALS — BP 101/69 | HR 68 | Temp 98.7°F | Resp 16 | Ht 60.0 in | Wt 127.0 lb

## 2017-11-28 DIAGNOSIS — R42 Dizziness and giddiness: Secondary | ICD-10-CM | POA: Diagnosis not present

## 2017-11-28 DIAGNOSIS — F988 Other specified behavioral and emotional disorders with onset usually occurring in childhood and adolescence: Secondary | ICD-10-CM | POA: Diagnosis not present

## 2017-11-28 DIAGNOSIS — S93492D Sprain of other ligament of left ankle, subsequent encounter: Secondary | ICD-10-CM

## 2017-11-28 DIAGNOSIS — F419 Anxiety disorder, unspecified: Secondary | ICD-10-CM

## 2017-11-28 NOTE — Patient Instructions (Addendum)
Tara Grant, Thank you for coming in to clinic today.  1. Your provider would like to you have your annual eye exam. Please contact your current eye doctor or here are some good options for you to contact.   [facility]   Address: [address], [place], [place] [address] Phone: [phone]  Website: [email or web address]   [facility] [address], [place], [place] [address] Phone: [phone] [email or web address]  [facility]  Address: [address], [place], [place] [address] Phone: [phone]   [facility] [address], [place] [address] Phone: [phone]  [facility] Address: [address], [place], [place] [address]  Phone: [phone]  2. Keep log of symptoms, timing, and how often this happens.  3. ADHD Drema BalzarineBruce R Thompson PHD, local Psychologist [address] [place], [place] [phone]  Please schedule a follow-up appointment with Wilhelmina McardleLauren Fernand Sorbello, AGNP.   - Dizziness: Return if symptoms worsen or fail to improve.  - ADHD: If you decide you want medication, please get a new ADHD evaluation with Clydie BraunKaren or Elna BreslowBruce Thompson above.  If you have any other questions or concerns, please feel free to call the clinic or send a message through MyChart. You may also schedule an earlier appointment if necessary.  You will receive a survey after today's visit either digitally by e-mail or paper by [employer] mail. Your experiences and feedback matter to us.  Please respond so we know how we are doing as we provide care for you.   Wilhelmina McardleLauren Yisel Megill, DNP, AGNP-BC Adult Gerontology Nurse Practitioner [facility] [place] [facility], [facility]

## 2017-11-28 NOTE — Progress Notes (Signed)
Subjective:    Patient ID: Tara Grant, female    DOB: 09-29-1981, 37 y.o.   MRN: [id number]  Tara Bellownita Shadduck is a 37 y.o. female presenting on 11/28/2017 for Dizziness (pain and jerking sensation in eye )   HPI Dizziness A/w pain and jerking sensation in bilateral eyes x 3 occurrences.  Always a left-ward gaze with spasm. - She notes it was worst the first time it occurred as she was very tired.  Had been sick for 5-6 days with fever, cough, nasal congestion.  Cold has improved.  Has been taking vit C, but not able to rest 2/2 busyness w/ work.  #1 Last Wed night approx 11:30 pm was using phone at bed"end of day communications.  Felt her eyeball moved away to left, was very dizzy.   #2 Has occurred again Thursday around 4 pm, driving and on phone.  1 hand on wheel.  Mild dizziness at that point, but continued having pain behind eyes.   #3 Occurred again Saturday 8pm w/ dizziness without eye twitching.   - Has astigmatism, glare/pain behind eyes normally, but never any twitching and is less intense pain. - Does get fairly significant eye strain after using electronics frequently throughout the day (computer and phone).  Anxiety - Stopped taking buspar r/t feeling better and events w/ eye-twitches.  Was previously only taking in am and as needed (2-3 pm --> increased HR w/ anxiety).  She has had no change in anxiety symptoms since stopping this med. - Wellbutrin - feeling well on this and does not desire to change.  Would like to consider dose decrease in future and possibly stopping med, but feels she needs it now.  ADHD Initial diagnosis ADD as 14-15 with formal pyschology evaluation.  She did not start medication then, but did use therapy for managing symptoms.  She did decide to start pharmaceutical therapy at 20, but did not like the way it made her feel.  Vyvanse - focused on work, but other things were much more suppressed than she preferred. - Her symptoms of depression are worsened by  inability to follow through and feelings of inadequacy/lower self-worth. - notes hyperactivity and decreased attention   Social History   Tobacco Use  . Smoking status: Former Smoker    Types: Cigarettes    Last attempt to quit: 01/11/2017    Years since quitting: 0.9  . Smokeless tobacco: Never Used  Substance Use Topics  . Alcohol use: No  . Drug use: No    Review of Systems Per HPI unless specifically indicated above     Objective:    BP 101/69   Pulse 68   Temp 98.7 F (37.1 C) (Oral)   Resp 16   Ht 5' (1.524 m)   Wt 127 lb (57.6 kg)   BMI 24.80 kg/m   Wt Readings from Last 3 Encounters:  11/28/17 127 lb (57.6 kg)  07/09/17 123 lb 6.4 oz (56 kg)  04/11/17 124 lb (56.2 kg)    Physical Exam  General - healthy, well-appearing, NAD HEENT - Normocephalic, atraumatic, Eyes atraumatic, normal ocular movements, normal pupillary response. Neck - supple, non-tender, no LAD Heart - RRR, no murmurs heard Lungs - Clear throughout all lobes, no wheezing, crackles, or rhonchi. Normal work of breathing. Extremeties - non-tender, no edema, cap refill < 2 seconds, peripheral pulses intact +2 bilaterally Skin - warm, dry Neuro - awake, alert, oriented x3, CN II-X intact, intact muscle strength 5/5 bilaterally, intact distal sensation to  light touch, normal coordination, normal gait Psych - Normal mood and affect, normal behavior      Assessment & Plan:   Problem List Items Addressed This Visit      Other   ADD (attention deficit disorder) - Primary Pt with recurrent and bothersome symptoms of ADD and would like to begin medications.  Has used Vyvanse in past, but didn't like the way it made her feel.  Stopped it and has been afraid/concerned to take it again.  Plan: 1. Pt should seek care for ADHD evaluation.  Can also continue care with Elna Breslow for behavioral modifications or seek care with Karen [place] as that is existing relationship. 2. Consider Adderall vs  Strattera in future if medications indicated. 3. Followup after psychology evaluation.    Anxiety    Pt continues to have significant symptom improvement since last visit with fewer panic attacks and better ability to move forward from stress.  Has already stopped taking buspar and is not having any increased anxiety after stopping.   Plan: 1. STOP bspar 5 mg once daily. 2. Continue wellbutrin XL 300 mg once daily. 3. Follow up 6 months or sooner if needed.       Other Visit Diagnoses    Dizziness     Transient dizziness, possibly related to eye muscle spasm as it cannot be reproduced today on exam with CN exam, head tilt, dix hallpike.  Unknown etiology or triggers.    Plan: 1. Keep log of occurrences and any possible triggers including screen time. 2. See care with optometrist since pt also was planning to visit soon prior to recent dizziness, eye movements. 3. Followup as needed. May need neurology referral.       Follow up plan: Return if symptoms worsen or fail to improve.   Wilhelmina Mcardle, DNP, AGPCNP-BC Adult Gerontology Primary Care Nurse Practitioner [facility] 12/19/2017, 2:11 PM

## 2017-12-03 ENCOUNTER — Other Ambulatory Visit: Payer: Self-pay | Admitting: Nurse Practitioner

## 2017-12-03 MED ORDER — ACYCLOVIR 800 MG PO TABS: 800.0000 mg | ORAL_TABLET | Freq: Two times a day (BID) | ORAL | 2 refills | Status: DC

## 2017-12-03 NOTE — Telephone Encounter (Signed)
Pt needs acyclovir sent to [facility] [phone]

## 2017-12-05 ENCOUNTER — Telehealth: Payer: Self-pay | Admitting: *Deleted

## 2017-12-05 ENCOUNTER — Other Ambulatory Visit: Payer: Self-pay | Admitting: Podiatry

## 2017-12-05 DIAGNOSIS — Z77018 Contact with and (suspected) exposure to other hazardous metals: Secondary | ICD-10-CM

## 2017-12-05 NOTE — Telephone Encounter (Signed)
Pt states her insurance said it would be cheaper to have the MRI in [facility] and they have not called. I gave pt the [phone].

## 2017-12-12 ENCOUNTER — Ambulatory Visit
Admission: RE | Admit: 2017-12-12 | Discharge: 2017-12-12 | Disposition: A | Discharge status: Home / Self Care | Payer: BLUE CROSS/BLUE SHIELD | Source: Ambulatory Visit | Attending: Podiatry | Admitting: Podiatry

## 2017-12-12 DIAGNOSIS — S93492D Sprain of other ligament of left ankle, subsequent encounter: Secondary | ICD-10-CM

## 2017-12-12 DIAGNOSIS — Z77018 Contact with and (suspected) exposure to other hazardous metals: Secondary | ICD-10-CM

## 2017-12-14 ENCOUNTER — Telehealth: Payer: Self-pay | Admitting: *Deleted

## 2017-12-14 NOTE — Telephone Encounter (Deleted)
-----   Message from Max T Hyatt, DPM sent at 12/12/2017  2:56 PM EST ----- Send for over read with last note please and inform patient of the delay. 

## 2017-12-14 NOTE — Telephone Encounter (Signed)
Faxed request for copy of MRI disc to [facility]. 

## 2017-12-14 NOTE — Telephone Encounter (Signed)
-----   Message from Elinor ParkinsonMax T Hyatt, [place] sent at 12/12/2017  2:56 PM EST ----- Send for over read with last note please and inform patient of the delay.

## 2017-12-14 NOTE — Telephone Encounter (Signed)
I informed pt of Dr. Geryl RankinsHyatt's review of result and request to send copy of MRI disc to radiology specialist for detailed reading for more treatment planning, and there would be a delay of 7-10 days for final results.

## 2017-12-19 ENCOUNTER — Encounter: Payer: Self-pay | Admitting: Nurse Practitioner

## 2017-12-19 NOTE — Assessment & Plan Note (Signed)
Pt continues to have significant symptom improvement since last visit with fewer panic attacks and better ability to move forward from stress.  Has already stopped taking buspar and is not having any increased anxiety after stopping.   Plan: 1. STOP bspar 5 mg once daily. 2. Continue wellbutrin XL 300 mg once daily. 3. Follow up 6 months or sooner if needed.

## 2017-12-21 ENCOUNTER — Telehealth: Payer: Self-pay | Admitting: *Deleted

## 2017-12-21 NOTE — Telephone Encounter (Signed)
2nd request for copy of MRI disc from [facility].

## 2017-12-21 NOTE — Telephone Encounter (Signed)
Entered in error

## 2017-12-27 ENCOUNTER — Telehealth: Payer: Self-pay

## 2017-12-27 ENCOUNTER — Telehealth: Payer: Self-pay | Admitting: Nurse Practitioner

## 2017-12-27 DIAGNOSIS — B001 Herpesviral vesicular dermatitis: Secondary | ICD-10-CM

## 2017-12-27 MED ORDER — ACYCLOVIR 800 MG PO TABS: 800.0000 mg | ORAL_TABLET | Freq: Two times a day (BID) | ORAL | 2 refills | Status: DC

## 2017-12-27 NOTE — Telephone Encounter (Signed)
I spoke with Lupita LeashDonna at [facility] radiology, she stated that the disc has been mailed out twice, but Vikki PortsValerie has not received it yet.  I will go by hospital today and pick up and send [facility] to [facility].

## 2017-12-27 NOTE — Telephone Encounter (Signed)
Mailed copy of MRI to [facility]. 

## 2017-12-27 NOTE — Telephone Encounter (Signed)
Copy of MRI disc mailed to [facility] 

## 2017-12-27 NOTE — Telephone Encounter (Signed)
Pt needs a refill on acyclovir sent to [facility].  Her call back [phone]

## 2018-01-01 ENCOUNTER — Encounter: Payer: Self-pay | Admitting: Podiatry

## 2018-01-02 ENCOUNTER — Telehealth: Payer: Self-pay | Admitting: *Deleted

## 2018-01-02 NOTE — Telephone Encounter (Signed)
-----   Message from Elinor ParkinsonMax T Hyatt, [place] sent at 01/01/2018 12:25 PM EDT ----- She needs to see Dr. Logan BoresEvans for F/U.  I think she might need an arthroscopy.

## 2018-01-02 NOTE — Telephone Encounter (Signed)
I informed pt of Dr. Geryl RankinsHyatt's review of results and orders, gave appt line [phone] to schedule.

## 2018-01-18 ENCOUNTER — Ambulatory Visit: Payer: BLUE CROSS/BLUE SHIELD | Admitting: Nurse Practitioner

## 2018-01-18 ENCOUNTER — Other Ambulatory Visit: Payer: Self-pay

## 2018-01-18 ENCOUNTER — Encounter: Payer: Self-pay | Admitting: Nurse Practitioner

## 2018-01-18 VITALS — BP 98/57 | HR 63 | Temp 98.2°F | Ht 60.0 in | Wt 130.4 lb

## 2018-01-18 DIAGNOSIS — G44201 Tension-type headache, unspecified, intractable: Secondary | ICD-10-CM

## 2018-01-18 NOTE — Progress Notes (Signed)
Subjective:    Patient ID: Tara Grant, female    DOB: Dec 05, 1980, 37 y.o.   MRN: [id number]  Tara Grant is a 37 y.o. female presenting on 01/18/2018 for Headache (pt states with sudden movement she notice stars. headaches possible related to allergies, with sinus pressure x 3 days)   HPI Headaches Patient reports severe headache over the last 3 days.  She has had previous episodic headaches including at last visit about 6 weeks ago.  Pain is described as pressure at temples that is "like a vice."  Headache was worst on Wednesday with nausea.  She also notes headache has worsened around 2 PM each day.  During her headache this week she has had some light sensitivity as well.  Has been seeing stars around upper vision with head pressure, but not outside of straining when defecating.   - Claritin helped some for one day, but had worse headache with being outside.   - Took Advil 200 mg plus phenylephrine. - Monday took 400 mg advil - pain resolves.  But returns at end of drug action at lower dose.  - Reports no additional eye twitches. - Drinks 1.5 c caffiene daily  Social History   Tobacco Use  . Smoking status: Former Smoker    Types: Cigarettes    Last attempt to quit: 01/11/2017    Years since quitting: 1.0  . Smokeless tobacco: Never Used  Substance Use Topics  . Alcohol use: No  . Drug use: No    Review of Systems Per HPI unless specifically indicated above     Objective:    BP (!) 98/57 (BP Location: Right Arm, Patient Position: Sitting, Cuff Size: Normal)   Pulse 63   Temp 98.2 F (36.8 C) (Oral)   Ht 5' (1.524 m)   Wt 130 lb 6.4 oz (59.1 kg)   BMI 25.47 kg/m   Wt Readings from Last 3 Encounters:  01/18/18 130 lb 6.4 oz (59.1 kg)  11/28/17 127 lb (57.6 kg)  07/09/17 123 lb 6.4 oz (56 kg)    Physical Exam  Constitutional: She is oriented to person, place, and time. She appears well-developed and well-nourished. No distress.  HENT:  Head: Normocephalic and  atraumatic.  Neurological: She is alert and oriented to person, place, and time. No cranial nerve deficit.  Skin: Skin is warm and dry.  Psychiatric: She has a normal mood and affect. Her behavior is normal. Judgment and thought content normal.  Vitals reviewed.      Assessment & Plan:   Problem List Items Addressed This Visit    None    Visit Diagnoses    Acute intractable tension-type headache    -  Primary    Acute tension-type headache is most consistent with symptoms of vice-like pressure around head.   Episodic pattern may be likely, but pt has not kept headache tracker.  Possible correlation to stress and/or eye strain.  Plan: 1. Discussed need for headache diary. 2. Pt should use abortive OTC medication ( use ibuprofen 600-800mg  in one dose, repeat up to 2 x daily) 3. Cannot exclude allergy component.  Start taking claritin daily for 2-4 weeks. Continue if effective for prevention of headache.    4.  Follow-up 6 weeks.   Follow up plan: Return in about 6 weeks (around 03/01/2018) for headaches.  A total of 25 minutes was spent face-to-face with this patient. Greater than 50% of this time was spent in counseling and coordination of care with the  patient.    Wilhelmina McardleLauren Kayd Launer, DNP, AGPCNP-BC Adult Gerontology Primary Care Nurse Practitioner [facility] 01/18/2018, 6:25 PM

## 2018-01-18 NOTE — Patient Instructions (Addendum)
Tara Grant,   Thank you for coming in to clinic today.  You are possibly having tension-type headaches.  - Take Claritin daily to prevent any allergy - Take 600-800 mg advil/ibuprofen at onset of headache to stop the headache.  You can take this up to two times a day.  Try to avoid taking this for more than 10 days.  Work to reduce eye strain.    Keep a headache diary/calendar.  Strattera is an option for ADHD treatment.  Please schedule a follow-up appointment with Wilhelmina Mcardle, AGNP. Return in about 6 weeks (around 03/01/2018) for headaches.  If you have any other questions or concerns, please feel free to call the clinic or send a message through MyChart. You may also schedule an earlier appointment if necessary.  You will receive a survey after today's visit either digitally by e-mail or paper by [employer]. Your experiences and feedback matter to [place].  Please respond so we know how we are doing as we provide care for you.   Wilhelmina Mcardle, DNP, AGNP-BC Adult Gerontology Nurse Practitioner [facility], [facility]   Tension Headache A tension headache is a feeling of pain, pressure, or aching that is often felt over the front and sides of the head. The pain can be dull, or it can feel tight (constricting). Tension headaches are not normally associated with nausea or vomiting, and they do not get worse with physical activity. Tension headaches can last from 30 minutes to several days. This is the most common type of headache. CAUSES The exact cause of this condition is not known. Tension headaches often begin after stress, anxiety, or depression. Other triggers may include:  Alcohol.  Too much caffeine, or caffeine withdrawal.  Respiratory infections, such as colds, flu, or sinus infections.  Dental problems or teeth clenching.  Fatigue.  Holding your head and neck in the same position for a long period of time, such as while using a  computer.  Smoking. SYMPTOMS Symptoms of this condition include:  A feeling of pressure around the head.  Dull, aching head pain.  Pain felt over the front and sides of the head.  Tenderness in the muscles of the head, neck, and shoulders. DIAGNOSIS This condition may be diagnosed based on your symptoms and a physical exam. Tests may be done, such as a CT scan or an MRI of your head. These tests may be done if your symptoms are severe or unusual. TREATMENT This condition may be treated with lifestyle changes and medicines to help relieve symptoms. HOME CARE INSTRUCTIONS Managing Pain  Take over-the-counter and prescription medicines only as told by your health care provider.  Lie down in a dark, quiet room when you have a headache.  If directed, apply ice to the head and neck area: ? Put ice in a plastic bag. ? Place a towel between your skin and the bag. ? Leave the ice on for 20 minutes, 2-3 times per day.  Use a heating pad or a hot shower to apply heat to the head and neck area as told by your health care provider. Eating and Drinking  Eat meals on a regular schedule.  Limit alcohol use.  Decrease your caffeine intake, or stop using caffeine. General Instructions  Keep all follow-up visits as told by your health care provider. This is important.  Keep a headache journal to help find out what may trigger your headaches. For example, write down: ? What you eat and drink. ? How  much sleep you get. ? Any change to your diet or medicines.  Try massage or other relaxation techniques.  Limit stress.  Sit up straight, and avoid tensing your muscles.  Do not use tobacco products, including cigarettes, chewing tobacco, or e-cigarettes. If you need help quitting, ask your health care provider.  Exercise regularly as told by your health care provider.  Get 7-9 hours of sleep, or the amount recommended by your health care provider. SEEK MEDICAL CARE IF:  Your  symptoms are not helped by medicine.  You have a headache that is different from what you normally experience.  You have nausea or you vomit.  You have a fever. SEEK IMMEDIATE MEDICAL CARE IF:  Your headache becomes severe.  You have repeated vomiting.  You have a stiff neck.  You have a loss of vision.  You have problems with speech.  You have pain in your eye or ear.  You have muscular weakness or loss of muscle control.  You lose your balance or you have trouble walking.  You feel faint or you pass out.  You have confusion. This information is not intended to replace advice given to you by your health care provider. Make sure you discuss any questions you have with your health care provider. Document Released: 10/09/2005 Document Revised: 06/30/2015 Document Reviewed: 02/01/2015 Elsevier Interactive Patient Education  2017 [employer]

## 2018-01-21 ENCOUNTER — Other Ambulatory Visit: Payer: BLUE CROSS/BLUE SHIELD

## 2018-01-22 ENCOUNTER — Ambulatory Visit: Payer: BLUE CROSS/BLUE SHIELD | Admitting: Podiatry

## 2018-01-22 ENCOUNTER — Encounter: Payer: Self-pay | Admitting: Podiatry

## 2018-01-22 DIAGNOSIS — M949 Disorder of cartilage, unspecified: Secondary | ICD-10-CM | POA: Diagnosis not present

## 2018-01-22 DIAGNOSIS — M899 Disorder of bone, unspecified: Secondary | ICD-10-CM | POA: Diagnosis not present

## 2018-01-29 NOTE — Progress Notes (Signed)
   HPI: 37 year old female presents the office today for surgical consultation and evaluation regarding chronic left ankle pain.  Patient has been treated and managed by Dr. Al CorpusHyatt and finally an MRI was ordered to the left ankle.  Findings were consistent with a talar dome lesion to the left posterior medial aspect of the talus.  Patient presents today to review the MRI and discuss possible surgery.  She has been having ongoing pain since 2015 when she went on a hike to [place].  All conservative modalities have failed with any satisfactory alleviation of symptoms of the patient.  She presents today for further treatment evaluation  Past Medical History:  Diagnosis Date  . ADD (attention deficit disorder)   . Anxiety   . Depression   . PTSD (post-traumatic stress disorder)      Physical Exam: General: The patient is alert and oriented x3 in no acute distress.  Dermatology: Skin is warm, dry and supple bilateral lower extremities. Negative for open lesions or macerations.  Vascular: Palpable pedal pulses bilaterally. No edema or erythema noted. Capillary refill within normal limits.  Neurological: Epicritic and protective threshold grossly intact bilaterally.   Musculoskeletal Exam: Range of motion within normal limits to all pedal and ankle joints bilateral. Muscle strength 5/5 in all groups bilateral.   MRI Impression:  1. 1.3 cm area of abnormal marrow edema within the medial talar dome, consistent with osteochondral lesion. No evidence of instability. 2. Mild posterior tibialis tenosynovitis.  Assessment: 1.  Talar dome lesion left ankle joint 2.  Posterior tibial tendinitis left   Plan of Care:  1. Patient evaluated.  MRI reviewed.  2.  Today we discussed surgical options regarding the patient's symptoms.  Recommend ankle arthroscopic synovectomy with osteochondral drilling/microfracture of the talar dome lesion. 3.  Today we discussed conservative versus surgical  management of the patient's symptoms.  Patient opts for surgical management.  All possible complications and details the procedure were explained.  No guarantees were expressed or implied.  All patient questions were answered. 4.  Authorization for surgery initiated today.  Surgery will consist of ankle arthroscopic synovectomy with microfracture/osteochondral drilling of the talar dome lesion left  5.  Return to clinic 1 week postop     Felecia ShellingBrent M. Horatio Bertz, DPM [facility]  Dr. Felecia ShellingBrent M. Erikka Follmer, DPM    [address]                                        [place], [place] [address]                Office [phone]  Fax [phone]

## 2018-01-30 ENCOUNTER — Telehealth: Payer: Self-pay | Admitting: Podiatry

## 2018-01-30 NOTE — Telephone Encounter (Signed)
I called the pt and left a voicemail that I just received confirmation that her requested records were faxed to [facility] to the fax number of [phone] that she provided us. Told pt if she had any other questions to call us back at [phone].

## 2018-02-20 ENCOUNTER — Encounter: Payer: Self-pay | Admitting: Podiatry

## 2018-02-20 ENCOUNTER — Telehealth: Payer: Self-pay | Admitting: *Deleted

## 2018-02-20 HISTORY — PX: OTHER SURGICAL HISTORY: SHX169

## 2018-02-20 NOTE — Telephone Encounter (Signed)
"  I am calling to schedule my surgery.  I saw Dr. Al Corpus first, then I saw Dr. Logan Bores.  He said I needed surgery.  So I'm calling to set that up."  Dr. Logan Bores does surgeries on Thursdays.  Do you have a date in mind?  "I'd like to do it on his earliest available.  He can do it on May 9 or 30.  "I will take May 9.  What time will I need to be there?"  You will receive a call from someone from the surgical center a day or two prior to your surgery date.  They will give you your arrival time.   (Please send surgical paperwork)

## 2018-02-20 NOTE — Telephone Encounter (Signed)
I left Tara Grant a message to call and schedule a consultation appointment with Dr. Logan Bores.  She needs to sign consent forms for the surgery that's scheduled for May 9.  I was informed she did not sign consent forms at the last visit because she was going for a second opinion.

## 2018-02-22 ENCOUNTER — Telehealth: Payer: Self-pay | Admitting: *Deleted

## 2018-02-22 NOTE — Telephone Encounter (Signed)
I attempted to call the patient to reschedule her surgery from 02/28/2018 to 03/07/2018.  Dr. Logan Bores has an emergency situation that has occurred and will not be able to perform surgery on that day.

## 2018-02-25 NOTE — Telephone Encounter (Addendum)
I left the patient another message to call me back.  I see where she had requested her clinical notes be sent to [facility] on 04/102019, she may not intend on having surgery with Dr. Logan Bores.  I am going to cancel the surgery.  She went by the [place] office on 02/20/2018 and signed the consent form.

## 2018-02-27 NOTE — Telephone Encounter (Signed)
I left the patient a message that I canceled the surgery scheduled for tomorrow.  I asked her to call if she would like to reschedule her surgery.

## 2018-02-28 ENCOUNTER — Telehealth: Payer: Self-pay | Admitting: Podiatry

## 2018-02-28 NOTE — Telephone Encounter (Addendum)
"  Thank you for calling me.  Did they tell you what happened?"  They did not tell me.  "Something was going on with my phone.  I wasn't receiving my voicemail messages.  I got the one yesterday and I was wondering what was going on.  I had been having problems receiving my messages so something told me to dial my voicemail the old fashioned way and low and behold I got all of your messages plus some that my friends had left me.  I am so sorry.  I hope he's okay or was he over booked?"  He just had an emergency that came up.  "So, when can we do the surgery?"  He can do it on Thursday, May 16.  "Okay that date will be fine.  What time?"  Someone from the surgical center will call you with the arrival time a day or two prior to your surgery date.

## 2018-02-28 NOTE — Telephone Encounter (Signed)
Spoke to patient this morning and the voicemail Tara Grant left didn't come through until after she left a voicemail asking why her apt was canceled. She wants to be called back at [phone] to go ahead and reschedule her appointment

## 2018-03-07 ENCOUNTER — Other Ambulatory Visit: Payer: Self-pay | Admitting: Podiatry

## 2018-03-07 ENCOUNTER — Encounter: Payer: Self-pay | Admitting: Podiatry

## 2018-03-07 DIAGNOSIS — M93272 Osteochondritis dissecans, left ankle and joints of left foot: Secondary | ICD-10-CM | POA: Diagnosis not present

## 2018-03-07 MED ORDER — HYDROCODONE-ACETAMINOPHEN 5-325 MG PO TABS: 1.0000 | ORAL_TABLET | Freq: Four times a day (QID) | ORAL | 0 refills | Status: DC | PRN

## 2018-03-07 NOTE — Progress Notes (Signed)
Patient called that she is allergic to percocet that was given to her by Dr. Logan Bores today postop. She has had no issues with hydrocodone. I sent this to the [facility] in [place] for her. She has no further questions  Tara Grant

## 2018-03-08 ENCOUNTER — Encounter: Payer: BLUE CROSS/BLUE SHIELD | Admitting: Podiatry

## 2018-03-08 ENCOUNTER — Telehealth: Payer: Self-pay | Admitting: Podiatry

## 2018-03-08 NOTE — Telephone Encounter (Signed)
Patient had surgery yesterday and picked up medicine. She is confused about what to until her next visit. Could you please give her a call asap.

## 2018-03-08 NOTE — Telephone Encounter (Signed)
I returned patient call but had to leave message to call office back

## 2018-03-11 NOTE — Telephone Encounter (Signed)
I called patient back again and left message that I was following up on her call from Friday and if she had any questions regarding her medication to call office back

## 2018-03-12 ENCOUNTER — Encounter: Payer: Self-pay | Admitting: Nurse Practitioner

## 2018-03-13 ENCOUNTER — Ambulatory Visit: Payer: BLUE CROSS/BLUE SHIELD | Admitting: Nurse Practitioner

## 2018-03-13 ENCOUNTER — Other Ambulatory Visit: Payer: Self-pay | Admitting: Nurse Practitioner

## 2018-03-13 ENCOUNTER — Other Ambulatory Visit: Payer: Self-pay

## 2018-03-13 ENCOUNTER — Encounter: Payer: Self-pay | Admitting: Nurse Practitioner

## 2018-03-13 VITALS — BP 99/57 | HR 73 | Temp 98.4°F | Ht 60.0 in | Wt 132.0 lb

## 2018-03-13 DIAGNOSIS — F4310 Post-traumatic stress disorder, unspecified: Secondary | ICD-10-CM

## 2018-03-13 DIAGNOSIS — F902 Attention-deficit hyperactivity disorder, combined type: Secondary | ICD-10-CM | POA: Diagnosis not present

## 2018-03-13 DIAGNOSIS — F419 Anxiety disorder, unspecified: Secondary | ICD-10-CM

## 2018-03-13 DIAGNOSIS — F321 Major depressive disorder, single episode, moderate: Secondary | ICD-10-CM

## 2018-03-13 MED ORDER — AMPHETAMINE-DEXTROAMPHET ER 5 MG PO CP24: 5.0000 mg | ORAL_CAPSULE | ORAL | 0 refills | Status: DC

## 2018-03-13 MED ORDER — BUPROPION HCL ER (XL) 300 MG PO TB24: 300.0000 mg | ORAL_TABLET | Freq: Every day | ORAL | 11 refills | Status: DC

## 2018-03-13 NOTE — Progress Notes (Signed)
Subjective:    Patient ID: Tara Grant, female    DOB: 09-22-1981, 37 y.o.   MRN: [id number]  Tara Grant is a 37 y.o. female presenting on 03/13/2018 for ADHD   HPI ADHD Pt has presented for medication after providing ADHD eval from psychology in 2012 (image sent via MyChart message 03/12/18).  Requested paper copy for record and obtaining more detailed evaluation from 2010 also.  Pt will request and submit in paper form to office or via PDF.  Similar ASRS today, so will proceed with treatment.  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  She is continuing non-pharm approaches with therapy and has reached a point where it is very difficult to work on stillness/meditation/grounding techniques.  Has had discussion that this may be related to childhood trauma/anxiety or ADHD.  Anxiety and PTSD are currently well controlled with non-pharm and Wellbutrin.  ADHD is currently untreated.  In general, pt requests to limit all pharmacologic therapies as much as possible.    Adult ADHD Self Report Scale (most recent)    Adult ADHD Self-Report Scale (ASRS-v1.1) Symptom Checklist - 03/13/18 1029      Part A   1. How often do you have trouble wrapping up the final details of a project, once the challenging parts have been done?  Very Often  (Abnormal)   2. How often do you have difficulty getting things done in order when you have to do a task that requires organization?  Often  (Abnormal)     3. How often do you have problems remembering appointments or obligations?  Sometimes  (Abnormal)   4. When you have a task that requires a lot of thought, how often do you avoid or delay getting started?  Very Often  (Abnormal)     5. How often do you fidget or squirm with your hands or feet when you have to sit down for a long time?  Very Often  (Abnormal)   6. How often do you feel overly active and compelled to do things, like you were  driven by a motor?  Very Often  (Abnormal)       Part B   7. How often do you make careless mistakes when you have to work on a boring or difficult project?  Sometimes  8. How often do you have difficulty keeping your attention when you are doing boring or repetitive work?  Often  (Abnormal)     9. How often do you have difficulty concentrating on what people say to you, even when they are speaking to you directly?  Often  (Abnormal)   10. How often do you misplace or have difficulty finding things at home or at work?  Sometimes    11. How often are you distracted by activity or noise around you?  Very Often  (Abnormal)   12. How often do you leave your seat in meetings or other situations in which you are expected to remain seated?  Sometimes  (Abnormal)     13. How often do you feel restless or fidgety?  Often  (Abnormal)   14. How often do you have difficulty unwinding and relaxing when you have time to yourself?  Often  (Abnormal)     15. How often do you find yourself talking too much when you are in social situations?  Often  (Abnormal)   16.  When you are in a conversation, how often do you find yourself finishing the sentences of the people you are talking to, before they can finish them themselves?  Sometimes  (Abnormal)     17. How often do you have difficulty waiting your turn in situations when turn taking is required?  Sometimes  18. How often do you interrupt others when they are busy?  Rarely      Comment   How old were you when these problems first began to occur?  10          Social History   Tobacco Use  . Smoking status: Former Smoker    Types: Cigarettes    Last attempt to quit: 01/11/2017    Years since quitting: 1.1  . Smokeless tobacco: Never Used  Substance Use Topics  . Alcohol use: No  . Drug use: No    Review of Systems Per HPI unless specifically indicated above     Objective:    BP (!) 99/57 (BP Location: Right Arm, Patient Position: Sitting, Cuff Size:  Normal)   Pulse 73   Temp 98.4 F (36.9 C) (Oral)   Ht 5' (1.524 m)   Wt 132 lb (59.9 kg) Comment: pt reports  BMI 25.78 kg/m   Wt Readings from Last 3 Encounters:  03/13/18 132 lb (59.9 kg)  01/18/18 130 lb 6.4 oz (59.1 kg)  11/28/17 127 lb (57.6 kg)    Physical Exam  Constitutional: She is oriented to person, place, and time. She appears well-developed and well-nourished. No distress.  HENT:  Head: Normocephalic and atraumatic.  Cardiovascular: Normal rate, regular rhythm, S1 normal, S2 normal, normal heart sounds and intact distal pulses.  Pulmonary/Chest: Effort normal and breath sounds normal. No respiratory distress.  Neurological: She is alert and oriented to person, place, and time.  Skin: Skin is warm and dry. Capillary refill takes less than 2 seconds.  Psychiatric: She has a normal mood and affect. Her behavior is normal. Judgment and thought content normal. Her speech is rapid and/or pressured. She expresses no homicidal and no suicidal ideation. She expresses no suicidal plans and no homicidal plans.  Vitals reviewed.     Results for orders placed or performed in visit on 07/09/17  Wound culture  Result Value Ref Range   MICRO NUMBER: [id number]    SPECIMEN QUALITY: ADEQUATE    SOURCE: RIGHT PERINEUM    STATUS: FINAL    GRAM STAIN:      No white blood cells seen No epithelial cells seen Moderate Gram positive cocci in pairs   ISOLATE 1: Staphylococcus aureus (A)       Susceptibility   Staphylococcus aureus - AEROBIC CULT, GRAM STAIN POSITIVE 1    VANCOMYCIN <=0.5 Sensitive     CIPROFLOXACIN <=0.5 Sensitive     CLINDAMYCIN <=0.25 Sensitive     LEVOFLOXACIN <=0.12 Sensitive     ERYTHROMYCIN >=8 Resistant     GENTAMICIN <=0.5 Sensitive     OXACILLIN* 0.5 Sensitive      * Oxacillin-susceptible staphylococci aresusceptible to other penicillinase-stablepenicillins (e.g. Methicillin, Nafcillin), beta-lactam/beta-lactamase inhibitor combinations, andcephems with  staphylococcal indications, includingCefazolin.    TETRACYCLINE <=1 Sensitive     TRIMETH/SULFA* <=10 Sensitive      * Oxacillin-susceptible staphylococci aresusceptible to other penicillinase-stablepenicillins (e.g. Methicillin, Nafcillin), beta-lactam/beta-lactamase inhibitor combinations, andcephems with staphylococcal indications, includingCefazolin.Legend:S = Susceptible  I = IntermediateR = Resistant  NS = Not susceptible* = Not tested  NR = Not reported**NN = See antimicrobic comments  Assessment & Plan:   Problem List Items Addressed This Visit      Other   ADD (attention deficit disorder) - Primary   Relevant Medications   amphetamine-dextroamphetamine (ADDERALL XR) 5 MG 24 hr capsule    Active and untreated ADHD with moderately severe symptoms of inattention and hyperactivity per today's ASRS above.  Pt with prior short term treatment of ADHD with adderall during master's degree that was helpful for focus.  Med was previously tolerated well without significant side effects.  Plan: 1. START adderall XR 5 mg daily.  Pt prefers to resume with low dose.  Low dose may not result in net benefit and may need dose increase at next visit. 2. Requested paper copies of psychology records from 2010 and 2012.  Pt to obtain. 3. Continue behavioral modification and work with therapist. 4. Followup 4 weeks.  Consider 3 month followup once on stable adderall dose.  Meds ordered this encounter  Medications  . amphetamine-dextroamphetamine (ADDERALL XR) 5 MG 24 hr capsule    Sig: Take 1 capsule (5 mg total) by mouth every morning.    Dispense:  30 capsule    Refill:  0    Order Specific Question:   Supervising Provider    Answer:   Smitty Cords [[id number]]    Follow up plan: Return in about 1 month (around 04/10/2018) for ADHD.  Wilhelmina Mcardle, DNP, AGPCNP-BC Adult Gerontology Primary Care Nurse Practitioner [facility] 03/13/2018,  11:01 AM

## 2018-03-13 NOTE — Patient Instructions (Addendum)
Brittony Dawood,   Thank you for coming in [facility] today.  1. START adderall XR 5 mg daily.    2. Continue work on behavioral modifications.  Try meditation again after ADHD is controlled.  Please schedule a follow-up appointment with Wilhelmina Mcardle, AGNP. Return in about 1 month (around 04/10/2018) for ADHD.  If you have any other questions or concerns, please feel free to call the clinic or send a message through MyChart. You may also schedule an earlier appointment if necessary.  You will receive a survey after today's visit either digitally by e-mail or paper by [employer]. Your experiences and feedback matter to [place].  Please respond so we know how we are doing as we provide care for you.   Wilhelmina Mcardle, DNP, AGNP-BC Adult Gerontology Nurse Practitioner [facility], [facility]

## 2018-03-13 NOTE — Telephone Encounter (Signed)
Pt. Called requesting refill on bupropion 300 mg called into [place]  [address]

## 2018-03-14 ENCOUNTER — Telehealth: Payer: Self-pay | Admitting: Podiatry

## 2018-03-14 NOTE — Telephone Encounter (Signed)
Returned patient call.  She had questions about swelling and taking medication.  All questions were answered and she will see Dr. Logan Bores tomorrow for 1st POV

## 2018-03-14 NOTE — Telephone Encounter (Signed)
Patient has some Post Op questions concerning her ankle. She has some swelling and needed to know about icing your ankle. She stated that when she put her ankle down on the grown, it turns black and goes numb like there is no circulation getting to her toes.

## 2018-03-15 ENCOUNTER — Ambulatory Visit (INDEPENDENT_AMBULATORY_CARE_PROVIDER_SITE_OTHER): Payer: BLUE CROSS/BLUE SHIELD | Admitting: Podiatry

## 2018-03-15 ENCOUNTER — Ambulatory Visit (INDEPENDENT_AMBULATORY_CARE_PROVIDER_SITE_OTHER): Payer: BLUE CROSS/BLUE SHIELD

## 2018-03-15 ENCOUNTER — Encounter: Payer: Self-pay | Admitting: Podiatry

## 2018-03-15 VITALS — BP 108/63 | HR 89 | Temp 99.1°F

## 2018-03-15 DIAGNOSIS — M899 Disorder of bone, unspecified: Secondary | ICD-10-CM

## 2018-03-15 DIAGNOSIS — M949 Disorder of cartilage, unspecified: Secondary | ICD-10-CM | POA: Diagnosis not present

## 2018-03-15 DIAGNOSIS — Z9889 Other specified postprocedural states: Secondary | ICD-10-CM

## 2018-03-18 NOTE — Progress Notes (Signed)
   Subjective:  Patient presents today status post ankle arthroscopy with talar dome drilling left. DOS: 03/07/18. She reports she is doing well overall. She states the pain as been tolerable with Tylenol and Meloxicam. She reports some associated numbness and discoloration of the toes when her foot is not elevated. She denies fever or chills. Patient is here for further evaluation and treatment.    Past Medical History:  Diagnosis Date  . ADD (attention deficit disorder)   . Anxiety   . Depression   . PTSD (post-traumatic stress disorder)       Objective/Physical Exam Neurovascular status intact.  Skin incisions appear to be well coapted with sutures and staples intact. No sign of infectious process noted. No dehiscence. No active bleeding noted. Moderate edema noted to the surgical extremity.  Radiographic Exam:  Normal osseous mineralization. Joint spaces preserved. No fracture/dislocation/boney destruction.    Assessment: 1. s/p ankle arthroscopy with talar dome drilling left. DOS: 03/07/18   Plan of Care:  1. Patient was evaluated. X-rays reviewed 2. Dressing changed.  3. Continue weightbearing in CAM boot.  4. Return to clinic in one week.    Felecia Shelling, DPM [facility]  Dr. Felecia Shelling, DPM    [address]                                        [place], [place] [address]                Office [phone]  Fax [phone]

## 2018-03-22 ENCOUNTER — Encounter: Payer: Self-pay | Admitting: Podiatry

## 2018-03-22 ENCOUNTER — Ambulatory Visit (INDEPENDENT_AMBULATORY_CARE_PROVIDER_SITE_OTHER): Payer: BLUE CROSS/BLUE SHIELD | Admitting: Podiatry

## 2018-03-22 DIAGNOSIS — M949 Disorder of cartilage, unspecified: Secondary | ICD-10-CM

## 2018-03-22 DIAGNOSIS — M899 Disorder of bone, unspecified: Secondary | ICD-10-CM

## 2018-03-22 DIAGNOSIS — Z9889 Other specified postprocedural states: Secondary | ICD-10-CM

## 2018-03-25 NOTE — Progress Notes (Signed)
   Subjective:  Patient presents today status post ankle arthroscopy with talar dome drilling left. DOS: 03/07/18. She states she is doing better. She reports some soreness in the left leg from the ankle upwards. There are no modifying factors noted. Patient is here for further evaluation and treatment.    Past Medical History:  Diagnosis Date  . ADD (attention deficit disorder)   . Anxiety   . Depression   . PTSD (post-traumatic stress disorder)       Objective/Physical Exam Neurovascular status intact.  Skin incisions appear to be well coapted with sutures and staples intact. No sign of infectious process noted. No dehiscence. No active bleeding noted. Moderate edema noted to the surgical extremity.  Assessment: 1. s/p ankle arthroscopy with talar dome drilling left. DOS: 03/07/18   Plan of Care:  1. Patient was evaluated.  2. Discontinue wearing CAM boot.  3. Recommended OTC ankle brace and compression anklet.  4. [facility].  5. Return to clinic in 2 weeks.    Felecia ShellingBrent M. Kamdin Follett, DPM [facility]  Dr. Felecia ShellingBrent M. Brennley Curtice, DPM    [address]                                        [place], [place] [address]                Office [phone]  Fax [phone]

## 2018-04-05 NOTE — Progress Notes (Signed)
DOS  03/07/2018  Ankle arthroscopic with microfracture/osteochondral drilling of the Talar dome lesion left.

## 2018-04-09 ENCOUNTER — Ambulatory Visit (INDEPENDENT_AMBULATORY_CARE_PROVIDER_SITE_OTHER): Payer: BLUE CROSS/BLUE SHIELD

## 2018-04-09 ENCOUNTER — Ambulatory Visit (INDEPENDENT_AMBULATORY_CARE_PROVIDER_SITE_OTHER): Payer: BLUE CROSS/BLUE SHIELD | Admitting: Podiatry

## 2018-04-09 ENCOUNTER — Encounter: Payer: Self-pay | Admitting: Podiatry

## 2018-04-09 DIAGNOSIS — Z9889 Other specified postprocedural states: Secondary | ICD-10-CM

## 2018-04-09 DIAGNOSIS — M949 Disorder of cartilage, unspecified: Secondary | ICD-10-CM

## 2018-04-09 DIAGNOSIS — M899 Disorder of bone, unspecified: Secondary | ICD-10-CM

## 2018-04-12 NOTE — Progress Notes (Signed)
   Subjective:  Patient presents today status post ankle arthroscopy with talar dome drilling left. DOS: 03/07/18. She states she is improving. She reports some continue pain around the incision sites. She has been wearing the compression anklet and ankle brace as directed. Patient is here for further evaluation and treatment.    Past Medical History:  Diagnosis Date  . ADD (attention deficit disorder)   . Anxiety   . Depression   . PTSD (post-traumatic stress disorder)       Objective/Physical Exam Neurovascular status intact.  Skin incisions appear to be well coapted. No sign of infectious process noted. No dehiscence. No active bleeding noted. Moderate edema noted to the surgical extremity.  Radiographic Exam:  Normal osseous mineralization. Joint spaces preserved. No fracture/dislocation/boney destruction.    Assessment: 1. s/p ankle arthroscopy with talar dome drilling left. DOS: 03/07/18   Plan of Care:  1. Patient was evaluated. X-Rays reviewed.  2. [facility] [place].  3. Continue wearing compression anklet and ankle brace.  4. Resume full activity with no restrictions.  5. Return to clinic in 8 weeks.     Felecia ShellingBrent M. Evans, DPM [facility]  Dr. Felecia ShellingBrent M. Evans, DPM    [address]                                        [place], [place] [address]                Office [phone]  Fax [phone]

## 2018-06-04 ENCOUNTER — Ambulatory Visit (INDEPENDENT_AMBULATORY_CARE_PROVIDER_SITE_OTHER): Payer: BLUE CROSS/BLUE SHIELD | Admitting: Podiatry

## 2018-06-04 ENCOUNTER — Encounter: Payer: Self-pay | Admitting: Podiatry

## 2018-06-04 ENCOUNTER — Ambulatory Visit (INDEPENDENT_AMBULATORY_CARE_PROVIDER_SITE_OTHER): Payer: BLUE CROSS/BLUE SHIELD

## 2018-06-04 DIAGNOSIS — M949 Disorder of cartilage, unspecified: Secondary | ICD-10-CM | POA: Diagnosis not present

## 2018-06-04 DIAGNOSIS — Z9889 Other specified postprocedural states: Secondary | ICD-10-CM | POA: Diagnosis not present

## 2018-06-04 DIAGNOSIS — M899 Disorder of bone, unspecified: Secondary | ICD-10-CM | POA: Diagnosis not present

## 2018-06-06 IMAGING — MR MR ANKLE*L* W/O CM
5 series · 37 of 40 positions shown · non-contrast
Comparison: Left ankle x-rays dated May 21, 2017.

CLINICAL DATA: Bimalleolar ankle pain. History of prior injury in
1281. No prior surgery.

EXAM:
MRI OF THE LEFT ANKLE WITHOUT CONTRAST
TECHNIQUE: Multiplanar, multisequence MR imaging of the ankle was performed. No
intravenous contrast was administered.

[Series 4: T2 fat-sat · axial · 3.0mm · 0.53mm/px · z∈[-95,+36]mm · 8 of 35 slices shown (1 of 3)]
[im 1/35]
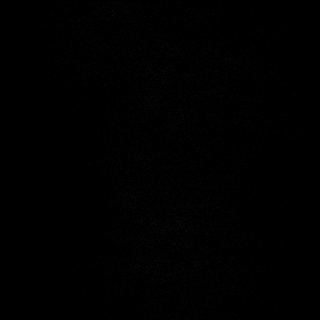
[im 4/35]
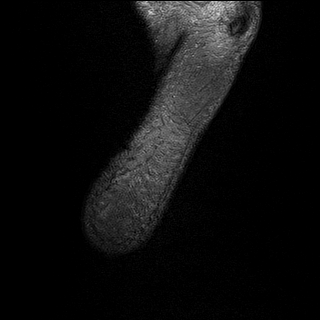
[im 12/35]
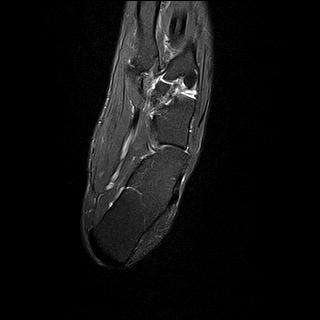
[im 16/35]
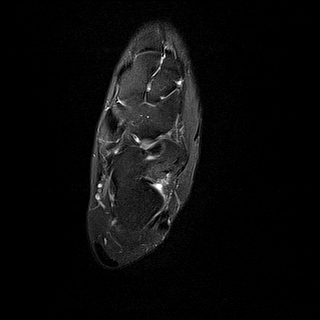
[im 19/35]
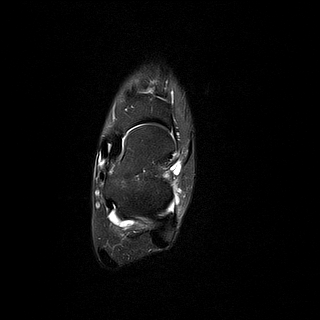
[im 23/35]
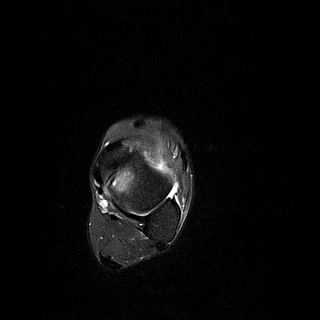
[im 31/35]
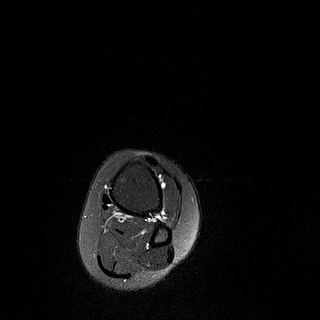
[im 35/35]
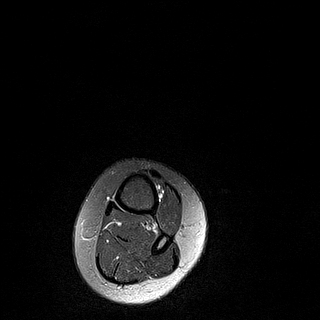

[Series 5: PD fat-sat · axial · 3.0mm · 0.44mm/px · z∈[-95,+36]mm · 9 of 35 slices shown]
[im 1/35]
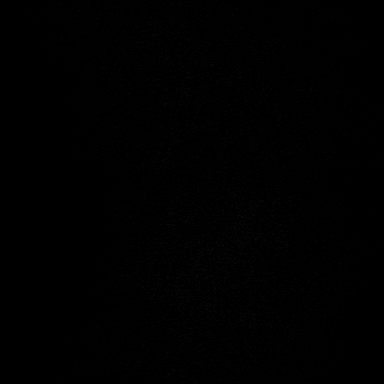
[im 5/35]
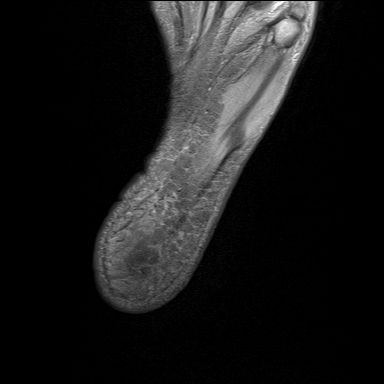
[im 9/35]
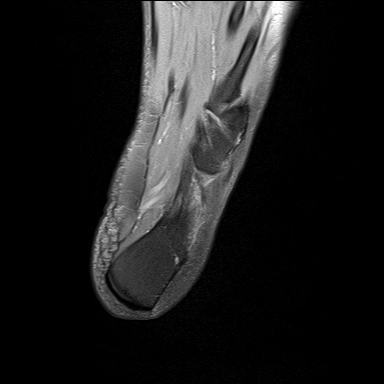
[im 13/35]
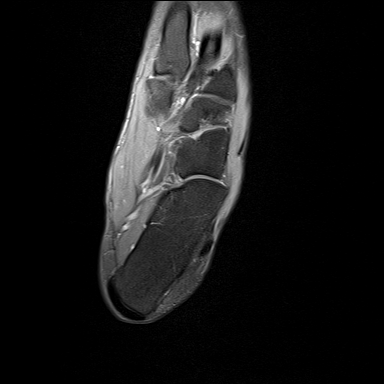
[im 18/35]
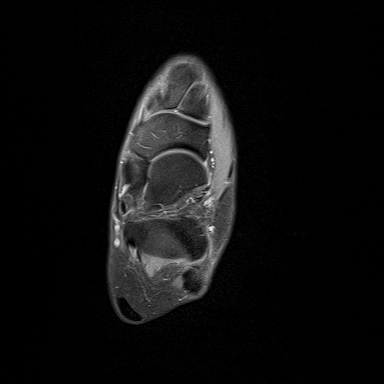
[im 22/35]
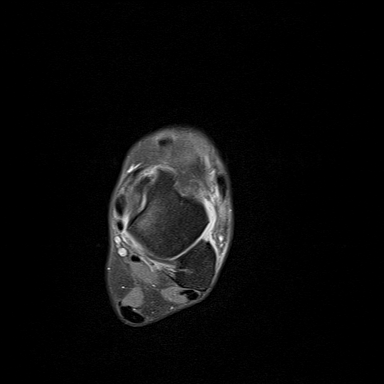
[im 26/35]
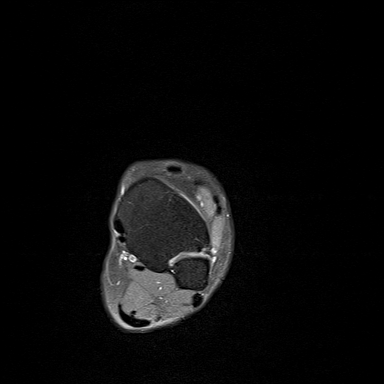
[im 30/35]
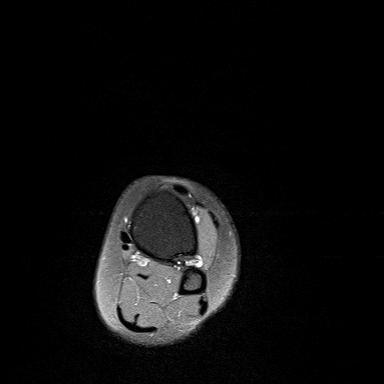
[im 35/35]
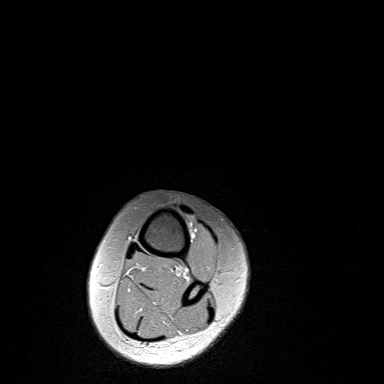

[Series 6: T1 · sagittal · 3.0mm · 0.40mm/px · 5 of 23 slices shown]
[im 1/23]
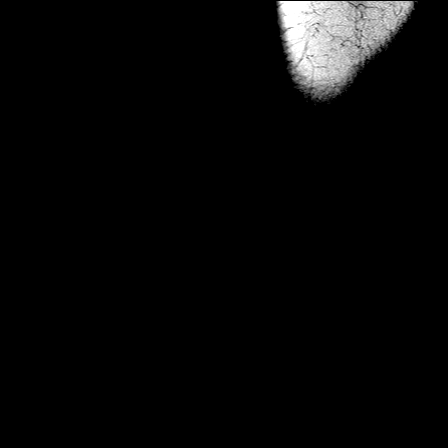
[im 5/23]
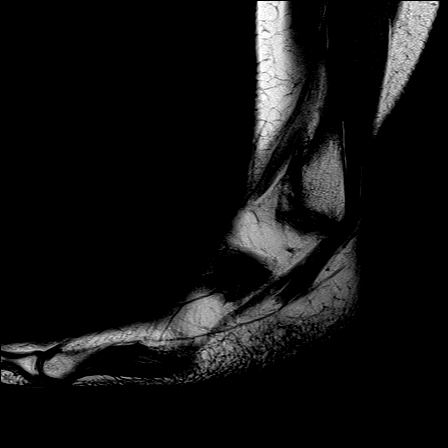
[im 9/23]
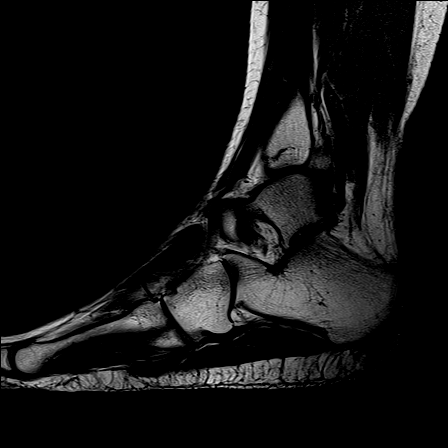
[im 14/23]
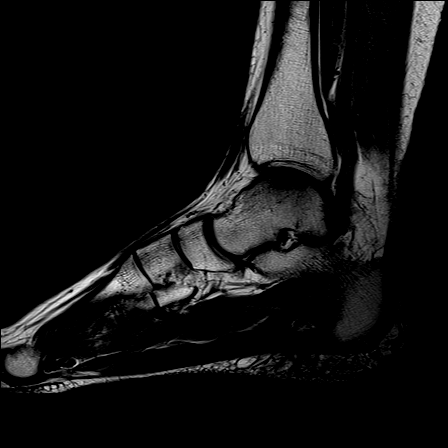
[im 18/23]
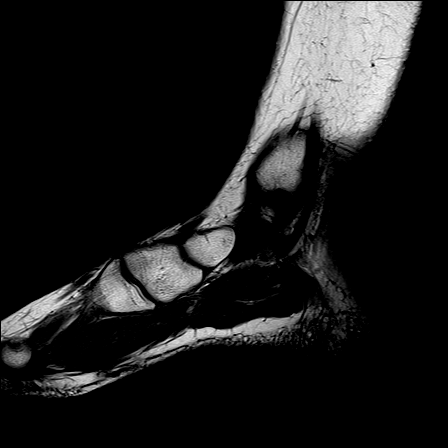

[Series 7: T2 fat-sat · sagittal · 3.0mm · 0.56mm/px · 6 of 23 slices shown (2 of 3)]
[im 1/23]
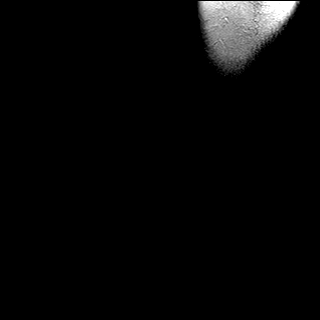
[im 5/23]
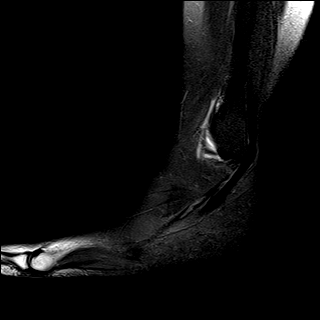
[im 9/23]
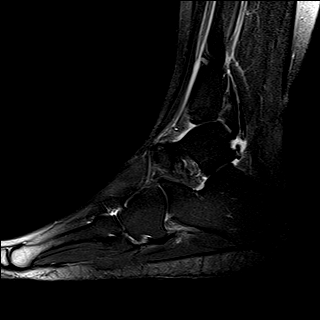
[im 14/23]
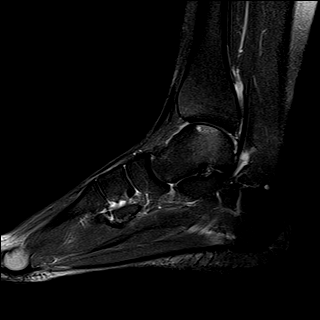
[im 18/23]
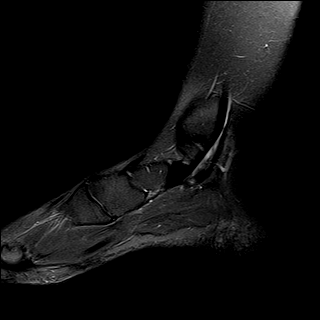
[im 23/23]
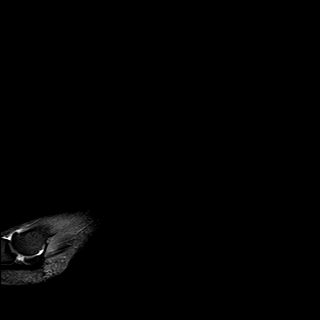

[Series 8: T2 fat-sat · coronal · 3.5mm · 0.47mm/px · 9 of 32 slices shown (3 of 3)]
[im 1/32]
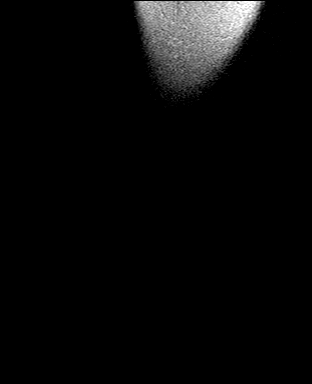
[im 4/32]
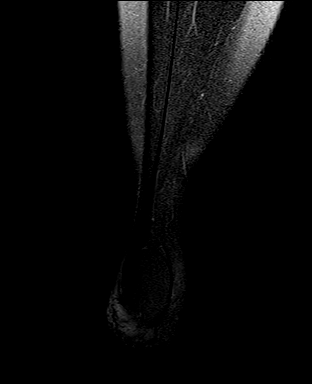
[im 8/32]
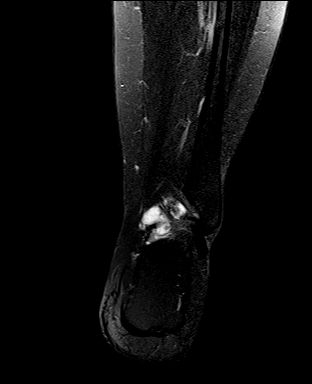
[im 12/32]
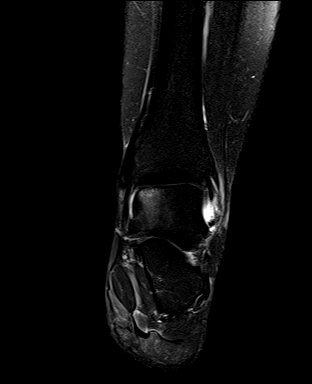
[im 16/32]
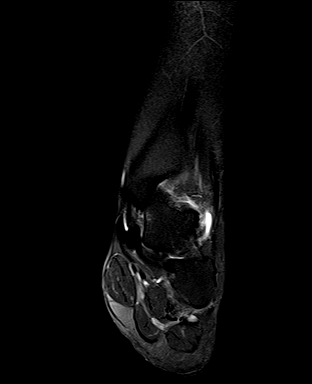
[im 20/32]
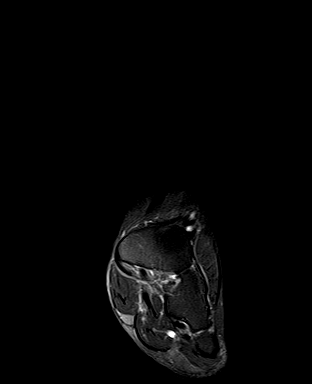
[im 24/32]
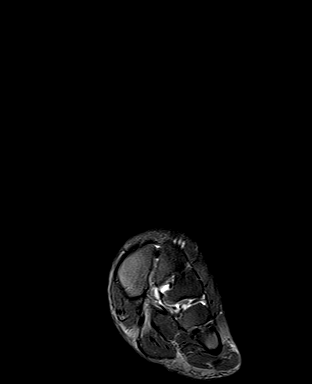
[im 28/32]
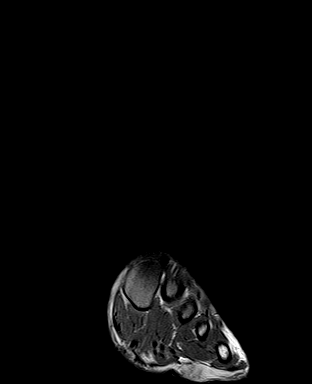
[im 32/32]
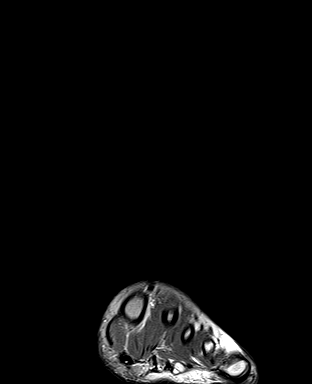

[37 of 40 positions shown; findings below may reference images not displayed]

FINDINGS: TENDONS

Peroneal: Peroneal longus tendon intact. Peroneal brevis intact.

Posteromedial: Posterior tibial tendon intact. Small amount of fluid
in the posterior tibial tendon sheath. Flexor hallucis longus tendon
intact. Flexor digitorum longus tendon intact.

Anterior: Tibialis anterior tendon intact. Extensor hallucis longus
tendon intact Extensor digitorum longus tendon intact.

Achilles:  Intact.

Plantar Fascia: Intact.

LIGAMENTS

Lateral: Anterior talofibular ligament intact. Calcaneofibular
ligament intact. Posterior talofibular ligament intact. Anterior and
posterior tibiofibular ligaments intact.

Medial: Deltoid ligament intact. Spring ligament intact.

CARTILAGE

Ankle Joint: Trace tibiotalar joint effusion. There is a 1.2 x 1.3 x
0.4 cm (AP by transverse by CC) area of abnormal marrow edema within
the medial talar dome. No evidence of overlying cartilage
fragmentation or focal defect.

Subtalar Joints/Sinus Tarsi: Normal subtalar joints. No subtalar
joint effusion. Normal sinus tarsi.

Bones: No fracture or dislocation.

Soft Tissue: Unremarkable.
IMPRESSION: 1. 1.3 cm area of abnormal marrow edema within the medial talar
dome, consistent with osteochondral lesion. No evidence of
instability.
2. Mild posterior tibialis tenosynovitis.

## 2018-06-06 NOTE — Progress Notes (Signed)
   Subjective:  Patient presents today status post ankle arthroscopy with talar dome drilling left. DOS: 03/07/18. She states she is doing well. She denies any new complaints at this time. She has been in physical therapy for treatment. Patient is here for further evaluation and treatment.    Past Medical History:  Diagnosis Date  . ADD (attention deficit disorder)   . Anxiety   . Depression   . PTSD (post-traumatic stress disorder)       Objective/Physical Exam Neurovascular status intact.  Skin incisions appear to be well coapted. No sign of infectious process noted. No dehiscence. No active bleeding noted. Moderate edema noted to the surgical extremity.  Radiographic Exam:  Normal osseous mineralization. Joint spaces preserved. No fracture/dislocation/boney destruction.    Assessment: 1. s/p ankle arthroscopy with talar dome drilling left. DOS: 03/07/18   Plan of Care:  1. Patient was evaluated. X-Rays reviewed.  2. Resume full activity with no restrictions.  3. Recommended good shoe gear.  4. Extension for physical therapy provided.  5. Return to clinic as needed.     Felecia ShellingBrent M. Evans, DPM [facility]  Dr. Felecia ShellingBrent M. Evans, DPM    [address]                                        [place], [place] [address]                Office [phone]  Fax [phone]

## 2018-08-08 ENCOUNTER — Ambulatory Visit (INDEPENDENT_AMBULATORY_CARE_PROVIDER_SITE_OTHER): Payer: BLUE CROSS/BLUE SHIELD | Admitting: Nurse Practitioner

## 2018-08-08 ENCOUNTER — Other Ambulatory Visit: Payer: Self-pay

## 2018-08-08 ENCOUNTER — Encounter: Payer: Self-pay | Admitting: Nurse Practitioner

## 2018-08-08 VITALS — BP 101/66 | HR 68 | Temp 98.9°F | Ht 60.0 in | Wt 130.0 lb

## 2018-08-08 DIAGNOSIS — F321 Major depressive disorder, single episode, moderate: Secondary | ICD-10-CM | POA: Diagnosis not present

## 2018-08-08 DIAGNOSIS — F902 Attention-deficit hyperactivity disorder, combined type: Secondary | ICD-10-CM

## 2018-08-08 DIAGNOSIS — F419 Anxiety disorder, unspecified: Secondary | ICD-10-CM

## 2018-08-08 DIAGNOSIS — Z789 Other specified health status: Secondary | ICD-10-CM

## 2018-08-08 MED ORDER — AMPHETAMINE-DEXTROAMPHETAMINE 5 MG PO TABS: 5.0000 mg | ORAL_TABLET | Freq: Every day | ORAL | 0 refills | Status: DC

## 2018-08-08 NOTE — Patient Instructions (Addendum)
Tara Grant,   Thank you for coming in to clinic today.  1. Continue yoga, physical activity  2. START body scan/grounding exercise where you connect physical feelings/tension/emptiness to your emotional feelings/stressors.  3. CHANGE Adderall to immediate release (about 5-7 hours). Continue in morning only.  Continue 5 mg dose.  Consider taking 2.5mg  dose if needed.  Please schedule a follow-up appointment with Wilhelmina Mcardle, AGNP. Return in about 4 weeks (around 09/05/2018) for ADHD.  If you have any other questions or concerns, please feel free to call the clinic or send a message through MyChart. You may also schedule an earlier appointment if necessary.  You will receive a survey after today's visit either digitally by e-mail or paper by [employer]. Your experiences and feedback matter to [place].  Please respond so we know how we are doing as we provide care for you.   Wilhelmina Mcardle, DNP, AGNP-BC Adult Gerontology Nurse Practitioner [facility], [facility]

## 2018-08-08 NOTE — Progress Notes (Signed)
Subjective:    Patient ID: Tara Grant, female    DOB: Oct 05, 1981, 37 y.o.   MRN: [id number]  Tara Grant is a 37 y.o. female presenting on 08/08/2018 for ADHD and Depression   HPI Depression In last 1.5 months has felt more depressed.  Felt Welbutrin was working well.  Feels tunnel vision, some guilt/shame cycle.  This guilt/shame is largely centered around inability to get work accomplished (see ADHD for additional info).  ADHD Stopped adderall about 4-5 days after starting due to lack of sleep.  Continues having hyperactivity.  Without a work deadline, reports she has very little motivation.  Has great motivation to get out of bed and does not feel significantly depressed, only with work.  Hives, allergy Continues having hives and anal itching with some food intake. Unknown foods, possibly gluten.  Developing new anal fissures related to this again.  Benadryl will help resolve this in about 30-60 minutes.  Had considered allergy referral in past for workup of allergies and now desires this approach.  GAD 7 : Generalized Anxiety Score 08/08/2018 04/11/2017 03/13/2017 02/25/2016  Nervous, Anxious, on Edge 2 1 3 3   Control/stop worrying 2 1 3 3   Worry too much - different things 2 1 3 3   Trouble relaxing 3 2 3 3   Restless 3 1 3 3   Easily annoyed or irritable 2 2 3 1   Afraid - awful might happen 1 0 1 0  Total GAD 7 Score 15 8 19 16   Anxiety Difficulty Somewhat difficult - Somewhat difficult Not difficult at all     Depression screen [facility] 2/9 08/08/2018 04/11/2017 03/13/2017 03/22/2016 02/25/2016  Decreased Interest 2 1 2 2 3   Down, Depressed, Hopeless 2 1 1 2 2   PHQ - 2 Score 4 2 3 4 5   Altered sleeping 0 1 1 0 3  Tired, decreased energy 1 1 2 3 2   Change in appetite 3 2 1 1 3   Feeling bad or failure about yourself  1 2 3 3 3   Trouble concentrating 3 3 3 3  -  Moving slowly or fidgety/restless 3 1 3 1 3   Suicidal thoughts 0 0 0 0 0  PHQ-9 Score 15 12 16 15 19   Difficult doing  work/chores Somewhat difficult - - Somewhat difficult -    Social History   Tobacco Use  . Smoking status: Former Smoker    Types: Cigarettes    Last attempt to quit: 01/11/2017    Years since quitting: 1.5  . Smokeless tobacco: Never Used  Substance Use Topics  . Alcohol use: No  . Drug use: No    Review of Systems Per HPI unless specifically indicated above     Objective:    BP 101/66   Pulse 68   Temp 98.9 F (37.2 C) (Oral)   Ht 5' (1.524 m)   Wt 130 lb (59 kg)   BMI 25.39 kg/m   Wt Readings from Last 3 Encounters:  08/08/18 130 lb (59 kg)  03/13/18 132 lb (59.9 kg)  01/18/18 130 lb 6.4 oz (59.1 kg)    Physical Exam  Constitutional: She is oriented to person, place, and time. She appears well-developed and well-nourished. No distress.  HENT:  Head: Normocephalic and atraumatic.  Cardiovascular: Normal rate, regular rhythm, S1 normal, S2 normal, normal heart sounds and intact distal pulses.  Pulmonary/Chest: Effort normal and breath sounds normal. No respiratory distress.  Neurological: She is alert and oriented to person, place, and time.  Skin: Skin  is warm and dry. Capillary refill takes less than 2 seconds.  Psychiatric: She has a normal mood and affect. Her behavior is normal. Judgment and thought content normal.  Vitals reviewed.  Results for orders placed or performed in visit on 07/09/17  Wound culture  Result Value Ref Range   MICRO NUMBER: [id number]    SPECIMEN QUALITY: ADEQUATE    SOURCE: RIGHT PERINEUM    STATUS: FINAL    GRAM STAIN:      No white blood cells seen No epithelial cells seen Moderate Gram positive cocci in pairs   ISOLATE 1: Staphylococcus aureus (A)       Susceptibility   Staphylococcus aureus - AEROBIC CULT, GRAM STAIN POSITIVE 1    VANCOMYCIN <=0.5 Sensitive     CIPROFLOXACIN <=0.5 Sensitive     CLINDAMYCIN <=0.25 Sensitive     LEVOFLOXACIN <=0.12 Sensitive     ERYTHROMYCIN >=8 Resistant     GENTAMICIN <=0.5 Sensitive      OXACILLIN* 0.5 Sensitive      * Oxacillin-susceptible staphylococci aresusceptible to other penicillinase-stablepenicillins (e.g. Methicillin, Nafcillin), beta-lactam/beta-lactamase inhibitor combinations, andcephems with staphylococcal indications, includingCefazolin.    TETRACYCLINE <=1 Sensitive     TRIMETH/SULFA* <=10 Sensitive      * Oxacillin-susceptible staphylococci aresusceptible to other penicillinase-stablepenicillins (e.g. Methicillin, Nafcillin), beta-lactam/beta-lactamase inhibitor combinations, andcephems with staphylococcal indications, includingCefazolin.Legend:S = Susceptible  I = IntermediateR = Resistant  NS = Not susceptible* = Not tested  NR = Not reported**NN = See antimicrobic comments      Assessment & Plan:   Problem List Items Addressed This Visit      Other   Depression Worsening, likely corresponds to worsening ADHD given patient places large emphasis on work performance and feels worse with anxiety and depression if ADHD/ADD is not controlled.  Denies SI/HI and has no plans to carry out if SI/HI arise.  - Discussed increasing Wellbutrin dose vs using SSRI/SNRI.  Patient declines to change today, instead focusing on ADHD optimization instead. - Strict instructions given that if not beginning to improve in next 2-3 weeks, she should contact clinic. - follow-up 4 weeks in office.    ADD (attention deficit disorder) - Primary Uncontrolled.  Patient very sensitive to Adderall XL and stopped taking it related to insomnia.  She will benefit from very low dose stimulant. - Change to adderall immediate release.  Take 2.5 - 5 mg once daily in am.  Advised patient will have decrease in effect from med about 6-7 hours after taking. - may benefit in future from psychiatry if unable to find balance in medication for ADD, anxiety, depression.  Consider referral within the next 2-6 months if no change in symptoms.  Sooner if symptoms continue worsening.  - Follow-up 4 weeks    Relevant Medications   amphetamine-dextroamphetamine (ADDERALL) 5 MG tablet   Anxiety Also worsening.  See AP depression.  Follow-up 4 weeks.    Other Visit Diagnoses    Allergy history unknown     Allergy, suspect to gluten but unknown trigger.  Referral [place] allergy for testing. Follow-up prn.   Relevant Orders   Ambulatory referral to Allergy      Meds ordered this encounter  Medications  . amphetamine-dextroamphetamine (ADDERALL) 5 MG tablet    Sig: Take 1 tablet (5 mg total) by mouth daily.    Dispense:  30 tablet    Refill:  0    Order Specific Question:   Supervising Provider    Answer:   Smitty Cords [  [id number]]    Follow up plan: Return in about 4 weeks (around 09/05/2018) for ADHD.  Wilhelmina Mcardle, DNP, AGPCNP-BC Adult Gerontology Primary Care Nurse Practitioner [facility] 08/08/2018, 2:25 PM

## 2018-08-13 ENCOUNTER — Encounter: Payer: Self-pay | Admitting: Nurse Practitioner

## 2018-08-16 ENCOUNTER — Telehealth: Payer: Self-pay | Admitting: Nurse Practitioner

## 2018-08-16 DIAGNOSIS — Z789 Other specified health status: Secondary | ICD-10-CM

## 2018-08-16 NOTE — Telephone Encounter (Signed)
The allergy place the pt was referred to does not test for gluten allergies.  She asked to be referred to GI.  Her call back [phone]

## 2018-08-16 NOTE — Telephone Encounter (Signed)
Referral to GI placed.  [facility] will call patient to schedule.

## 2018-09-09 ENCOUNTER — Encounter: Payer: Self-pay | Admitting: Nurse Practitioner

## 2018-09-09 ENCOUNTER — Ambulatory Visit (INDEPENDENT_AMBULATORY_CARE_PROVIDER_SITE_OTHER): Payer: BLUE CROSS/BLUE SHIELD | Admitting: Nurse Practitioner

## 2018-09-09 VITALS — BP 96/62 | HR 80 | Ht 60.0 in | Wt 128.4 lb

## 2018-09-09 DIAGNOSIS — L290 Pruritus ani: Secondary | ICD-10-CM

## 2018-09-09 MED ORDER — NYSTATIN-TRIAMCINOLONE 100000-0.1 UNIT/GM-% EX OINT: 1.0000 "application " | TOPICAL_OINTMENT | Freq: Two times a day (BID) | CUTANEOUS | 0 refills | Status: AC

## 2018-09-09 NOTE — Progress Notes (Signed)
Subjective:    Patient ID: Tara Grant, female    DOB: 1981-07-03, 37 y.o.   MRN: [id number]  Tara Grant is a 37 y.o. female presenting on 09/09/2018 for Rash (itchy. developed on the anus area. possible hemorrhoid)   HPI Anal Itching Diarrhea with fever and nausea and vomiting on Sunday-Monday prior to itching. Is concerned for itching.  Had fever through Tuesday.  Had anal itching that started Thursday night after shower.  Had high amounts sugar and may have been trigger.  Was also under high stress.    Grandmother and sister both have psoriasis. Patient states the rash has the appearance of psoriasis.  Social History   Tobacco Use  . Smoking status: Former Smoker    Types: Cigarettes    Last attempt to quit: 01/11/2017    Years since quitting: 1.6  . Smokeless tobacco: Never Used  Substance Use Topics  . Alcohol use: No  . Drug use: No    Review of Systems Per HPI unless specifically indicated above     Objective:    BP 96/62 (BP Location: Right Arm, Patient Position: Sitting, Cuff Size: Normal)   Pulse 80   Ht 5' (1.524 m)   Wt 128 lb 6.4 oz (58.2 kg)   BMI 25.08 kg/m   Wt Readings from Last 3 Encounters:  09/09/18 128 lb 6.4 oz (58.2 kg)  08/08/18 130 lb (59 kg)  03/13/18 132 lb (59.9 kg)    Physical Exam  Constitutional: She is oriented to person, place, and time. She appears well-developed and well-nourished. No distress.  HENT:  Head: Normocephalic and atraumatic.  Cardiovascular: Normal rate, regular rhythm, S1 normal, S2 normal, normal heart sounds and intact distal pulses.  Pulmonary/Chest: Effort normal and breath sounds normal. No respiratory distress.  Genitourinary:     Neurological: She is alert and oriented to person, place, and time.  Skin: Skin is warm and dry. Capillary refill takes less than 2 seconds.  Psychiatric: She has a normal mood and affect. Her behavior is normal.  Vitals reviewed.  Results for orders placed or performed in  visit on 07/09/17  Wound culture  Result Value Ref Range   MICRO NUMBER: [id number]    SPECIMEN QUALITY: ADEQUATE    SOURCE: RIGHT PERINEUM    STATUS: FINAL    GRAM STAIN:      No white blood cells seen No epithelial cells seen Moderate Gram positive cocci in pairs   ISOLATE 1: Staphylococcus aureus (A)       Susceptibility   Staphylococcus aureus - AEROBIC CULT, GRAM STAIN POSITIVE 1    VANCOMYCIN <=0.5 Sensitive     CIPROFLOXACIN <=0.5 Sensitive     CLINDAMYCIN <=0.25 Sensitive     LEVOFLOXACIN <=0.12 Sensitive     ERYTHROMYCIN >=8 Resistant     GENTAMICIN <=0.5 Sensitive     OXACILLIN* 0.5 Sensitive      * Oxacillin-susceptible staphylococci aresusceptible to other penicillinase-stablepenicillins (e.g. Methicillin, Nafcillin), beta-lactam/beta-lactamase inhibitor combinations, andcephems with staphylococcal indications, includingCefazolin.    TETRACYCLINE <=1 Sensitive     TRIMETH/SULFA* <=10 Sensitive      * Oxacillin-susceptible staphylococci aresusceptible to other penicillinase-stablepenicillins (e.g. Methicillin, Nafcillin), beta-lactam/beta-lactamase inhibitor combinations, andcephems with staphylococcal indications, includingCefazolin.Legend:S = Susceptible  I = IntermediateR = Resistant  NS = Not susceptible* = Not tested  NR = Not reported**NN = See antimicrobic comments      Assessment & Plan:   Problem List Items Addressed This Visit    None  Visit Diagnoses    Anal itching    -  Primary   Relevant Medications   nystatin-triamcinolone ointment (MYCOLOG)    Patient has acute anal itching following episode of diarrhea, bathing with new products.  Erythema and rash consistent with stool associated damage vs fungal rash.  Plan: 1. Start Mycolog bid x 5 days  2. May also use zinc oxide as moisture barrier and protectant. 3. Follow-up prn.  Meds ordered this encounter  Medications  . nystatin-triamcinolone ointment (MYCOLOG)    Sig: Apply 1 application topically 2  (two) times daily for 5 days.    Dispense:  15 g    Refill:  0    Order Specific Question:   Supervising Provider    Answer:   Smitty CordsKARAMALEGOS, ALEXANDER J [[id number]]    Follow up plan: Return if symptoms worsen or fail to improve.  Tara McardleLauren Edeline Greening, DNP, AGPCNP-BC Adult Gerontology Primary Care Nurse Practitioner [facility] 09/09/2018, 3:32 PM

## 2018-09-09 NOTE — Patient Instructions (Addendum)
Robert BellowAnita Walmer,   Thank you for coming in to clinic today.  1. Start nystatin-triamcinolone ointment at opening of anus twice daily for 5-7days  - may also find benefit from zinc oxide ointment (diaper rash cream) as a barrier over next couple of days.  Use as often as needed.  Completely clear ointments before next application.  Please schedule a follow-up appointment with Wilhelmina McardleLauren Corrado Hymon, AGNP. Return if symptoms worsen or fail to improve.  If you have any other questions or concerns, please feel free to call the clinic or send a message through MyChart. You may also schedule an earlier appointment if necessary.  You will receive a survey after today's visit either digitally by e-mail or paper by [employer] mail. Your experiences and feedback matter to us.  Please respond so we know how we are doing as we provide care for you.   Wilhelmina McardleLauren Jalien Weakland, DNP, AGNP-BC Adult Gerontology Nurse Practitioner [facility], [facility]

## 2018-09-13 ENCOUNTER — Other Ambulatory Visit: Payer: Self-pay | Admitting: Podiatry

## 2018-09-30 ENCOUNTER — Encounter: Payer: Self-pay | Admitting: Gastroenterology

## 2018-09-30 ENCOUNTER — Ambulatory Visit (INDEPENDENT_AMBULATORY_CARE_PROVIDER_SITE_OTHER): Payer: BLUE CROSS/BLUE SHIELD | Admitting: Gastroenterology

## 2018-09-30 ENCOUNTER — Encounter

## 2018-09-30 ENCOUNTER — Other Ambulatory Visit: Payer: Self-pay

## 2018-09-30 VITALS — BP 103/66 | HR 91 | Resp 17 | Ht 60.0 in | Wt 129.4 lb

## 2018-09-30 DIAGNOSIS — R1013 Epigastric pain: Secondary | ICD-10-CM

## 2018-09-30 DIAGNOSIS — R21 Rash and other nonspecific skin eruption: Secondary | ICD-10-CM
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
## 2018-10-01 ENCOUNTER — Ambulatory Visit (INDEPENDENT_AMBULATORY_CARE_PROVIDER_SITE_OTHER): Payer: BLUE CROSS/BLUE SHIELD | Admitting: Podiatry

## 2018-10-01 ENCOUNTER — Encounter: Payer: Self-pay | Admitting: Podiatry

## 2018-10-01 DIAGNOSIS — M659 Synovitis and tenosynovitis, unspecified: Secondary | ICD-10-CM

## 2018-10-01 DIAGNOSIS — M899 Disorder of bone, unspecified: Secondary | ICD-10-CM | POA: Diagnosis not present

## 2018-10-01 DIAGNOSIS — M949 Disorder of cartilage, unspecified: Secondary | ICD-10-CM | POA: Diagnosis not present

## 2018-10-01 DIAGNOSIS — Z9889 Other specified postprocedural states: Secondary | ICD-10-CM

## 2018-10-01 MED ORDER — MELOXICAM 15 MG PO TABS: 15.0000 mg | ORAL_TABLET | Freq: Every day | ORAL | 1 refills | Status: DC

## 2018-10-02 LAB — FOOD ALLERGY PROFILE
Allergen Corn, IgE: <0.1 kU/L
Codfish IgE: <0.1 kU/L
MILK IGE: 0.15 kU/L — AB
Peanut IgE: <0.1 kU/L
Scallop IgE: <0.1 kU/L
Shrimp IgE: <0.1 kU/L
Walnut IgE: <0.1 kU/L
Wheat IgE: 0.14 kU/L — AB

## 2018-10-02 LAB — IMMUNOGLOBULINS A/E/G/M, SERUM
IGA/IMMUNOGLOBULIN A, SERUM: 238 mg/dL (ref 87–352)
IGG (IMMUNOGLOBIN G), SERUM: 905 mg/dL (ref 700–1600)
IGM (IMMUNOGLOBULIN M), SRM: 110 mg/dL (ref 26–217)
IgE (Immunoglobulin E), Serum: 271 IU/mL (ref 6–495)

## 2018-10-02 NOTE — Progress Notes (Signed)
   Subjective:  Patient presents today status post ankle arthroscopy with talar dome drilling left. DOS: 03/07/18. She states she is doing well. She reports some intermittent pain only with walking. She has been taking Meloxicam for treatment. She states she finished physical therapy 2-3 weeks ago. Patient is here for further evaluation and treatment.    Past Medical History:  Diagnosis Date  . ADD (attention deficit disorder)   . Anxiety   . Depression   . PTSD (post-traumatic stress disorder)      Objective: Physical Exam General: The patient is alert and oriented x3 in no acute distress.  Dermatology: Skin is cool, dry and supple bilateral lower extremities. Negative for open lesions or macerations.  Vascular: Palpable pedal pulses bilaterally. No edema or erythema noted. Capillary refill within normal limits.  Neurological: Epicritic and protective threshold grossly intact bilaterally.   Musculoskeletal Exam: All pedal and ankle joints range of motion within normal limits bilateral. Muscle strength 5/5 in all groups bilateral.     Assessment: 1. s/p ankle arthroscopy with talar dome drilling left. DOS: 03/07/18   Plan of Care:  1. Patient was evaluated.  2. Refill prescription for Meloxicam provided to patient.  3. May resume full activity with no restrictions.  4. Recommended good shoe gear.  5. Return to clinic as needed.    Felecia ShellingBrent M. Jhett Fretwell, DPM [facility]  Dr. Felecia ShellingBrent M. Tehilla Coffel, DPM    [address]                                        [place], [place] [address]                Office [phone]  Fax [phone]

## 2018-10-04 LAB — ALPHA-GAL PANEL
ALPHA GAL IGE: 0.83 kU/L — AB (ref <0.10)
Beef (Bos spp) IgE: 0.43 kU/L — ABNORMAL HIGH (ref <0.35)
Class Interpretation: 1
LAMB/MUTTON (OVIS SPP) IGE: 0.18 kU/L (ref <0.35)
Pork (Sus spp) IgE: 0.21 kU/L (ref <0.35)

## 2018-10-07 ENCOUNTER — Encounter: Payer: Self-pay | Admitting: *Deleted

## 2018-10-08 ENCOUNTER — Encounter: Payer: Self-pay | Admitting: *Deleted

## 2018-10-08 ENCOUNTER — Encounter
Admission: RE | Disposition: A | Discharge status: Home / Self Care | Payer: Self-pay | Source: Home / Self Care | Attending: Gastroenterology

## 2018-10-08 ENCOUNTER — Ambulatory Visit
Admission: RE | Admit: 2018-10-08 | Discharge: 2018-10-08 | Disposition: A | Discharge status: Home / Self Care | Payer: BLUE CROSS/BLUE SHIELD | Attending: Gastroenterology | Admitting: Gastroenterology

## 2018-10-08 ENCOUNTER — Ambulatory Visit: Payer: BLUE CROSS/BLUE SHIELD | Admitting: Certified Registered"

## 2018-10-08 DIAGNOSIS — Z791 Long term (current) use of non-steroidal anti-inflammatories (NSAID): Secondary | ICD-10-CM | POA: Diagnosis not present

## 2018-10-08 DIAGNOSIS — Z79899 Other long term (current) drug therapy: Secondary | ICD-10-CM | POA: Insufficient documentation

## 2018-10-08 DIAGNOSIS — Z87891 Personal history of nicotine dependence: Secondary | ICD-10-CM | POA: Insufficient documentation

## 2018-10-08 DIAGNOSIS — F329 Major depressive disorder, single episode, unspecified: Secondary | ICD-10-CM | POA: Diagnosis not present

## 2018-10-08 DIAGNOSIS — R140 Abdominal distension (gaseous): Secondary | ICD-10-CM | POA: Diagnosis not present

## 2018-10-08 DIAGNOSIS — R1013 Epigastric pain: Secondary | ICD-10-CM

## 2018-10-08 DIAGNOSIS — K2950 Unspecified chronic gastritis without bleeding: Secondary | ICD-10-CM | POA: Insufficient documentation

## 2018-10-08 DIAGNOSIS — F988 Other specified behavioral and emotional disorders with onset usually occurring in childhood and adolescence: Secondary | ICD-10-CM | POA: Insufficient documentation

## 2018-10-08 HISTORY — PX: ESOPHAGOGASTRODUODENOSCOPY (EGD) WITH PROPOFOL: SHX5813

## 2018-10-08 SURGERY — ESOPHAGOGASTRODUODENOSCOPY (EGD) WITH PROPOFOL
Anesthesia: General

## 2018-10-08 MED ORDER — PROPOFOL 500 MG/50ML IV EMUL
INTRAVENOUS | Status: DC | PRN
  Administered 2018-10-08: 130 ug/kg/min via INTRAVENOUS

## 2018-10-08 MED ORDER — MIDAZOLAM HCL 2 MG/2ML IJ SOLN
INTRAMUSCULAR | Status: DC | PRN
  Administered 2018-10-08: 2 mg via INTRAVENOUS

## 2018-10-08 MED ORDER — MIDAZOLAM HCL 2 MG/2ML IJ SOLN
INTRAMUSCULAR | Status: AC
  Filled 2018-10-08: qty 2

## 2018-10-08 MED ORDER — LIDOCAINE HCL (CARDIAC) PF 100 MG/5ML IV SOSY
PREFILLED_SYRINGE | INTRAVENOUS | Status: DC | PRN
  Administered 2018-10-08: 60 mg via INTRAVENOUS

## 2018-10-08 MED ORDER — PROPOFOL 10 MG/ML IV BOLUS
INTRAVENOUS | Status: DC | PRN
  Administered 2018-10-08: 70 mg via INTRAVENOUS
  Administered 2018-10-08: 20 mg via INTRAVENOUS

## 2018-10-08 MED ORDER — SODIUM CHLORIDE 0.9 % IV SOLN: INTRAVENOUS | Status: DC

## 2018-10-08 NOTE — Op Note (Signed)
[facility] Gastroenterology Patient Name: Tara Grant Procedure Date: 10/08/2018 2:43 PM MRN: [id number] Account #: [id number] Date of Birth: 03-20-1981 Admit Type: Outpatient Age: 37 Room: [facility] ENDO ROOM 4 Gender: Female Note Status: Finalized Procedure:            Upper GI endoscopy Indications:          Abdominal bloating Providers:            Lin Landsman MD, MD Referring MD:         Mi[employer] (Referring MD) Medicines:            Monitored Anesthesia Care Complications:        No immediate complications. Estimated blood loss: None. Procedure:            Pre-Anesthesia Assessment:                       - Prior to the procedure, a History and Physical was                        performed, and patient medications and allergies were                        reviewed. The patient is competent. The risks and                        benefits of the procedure and the sedation options and                        risks were discussed with the patient. All questions                        were answered and informed consent was obtained.                        Patient identification and proposed procedure were                        verified by the physician, the nurse, the                        anesthesiologist, the anesthetist and the technician in                        the pre-procedure area in the procedure room in the                        endoscopy suite. Mental Status Examination: alert and                        oriented. Airway Examination: normal oropharyngeal                        airway and neck mobility. Respiratory Examination:                        clear to auscultation. CV Examination: normal.                        Prophylactic Antibiotics: The patient does not require  prophylactic antibiotics. Prior Anticoagulants: The                        patient has taken no previous anticoagulant or   antiplatelet agents. ASA Grade Assessment: II - A                        patient with mild systemic disease. After reviewing the                        risks and benefits, the patient was deemed in                        satisfactory condition to undergo the procedure. The                        anesthesia plan was to use monitored anesthesia care                        (MAC). Immediately prior to administration of                        medications, the patient was re-assessed for adequacy                        to receive sedatives. The heart rate, respiratory rate,                        oxygen saturations, blood pressure, adequacy of                        pulmonary ventilation, and response to care were                        monitored throughout the procedure. The physical status                        of the patient was re-assessed after the procedure.                       After obtaining informed consent, the endoscope was                        passed under direct vision. Throughout the procedure,                        the patient's blood pressure, pulse, and oxygen                        saturations were monitored continuously. The Endoscope                        was introduced through the mouth, and advanced to the                        second part of duodenum. The upper GI endoscopy was                        accomplished without difficulty. The patient tolerated  the procedure well. Findings:      The duodenal bulb and second portion of the duodenum were normal.       Biopsies for histology were taken with a cold forceps for evaluation of       celiac disease.      The entire examined stomach was normal. Biopsies were taken with a cold       forceps for Helicobacter pylori testing.      The cardia and gastric fundus were normal on retroflexion.      Esophagogastric landmarks were identified: the gastroesophageal junction       was found at 35 cm from  the incisors.      The gastroesophageal junction and examined esophagus were normal.      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 Impression:           - Normal duodenal bulb and second portion of the                        duodenum. Biopsied.                       - Normal stomach. Biopsied.                       - Esophagogastric landmarks identified.                       - Normal gastroesophageal junction and esophagus. Recommendation:       - Discharge patient to home (with parent).                       - Resume previous diet today.                       - Continue present medications.                       - Await pathology results.                       - Return to my office as previously scheduled. Procedure Code(s):    --- Professional ---                       [id number], Esophagogastroduodenoscopy, flexible, transoral;                        with biopsy, single or multiple Diagnosis Code(s):    --- Professional ---                       R14.0, Abdominal distension (gaseous) CPT copyright 2018 American Medical Association. All rights reserved. The codes documented in this report are preliminary and upon coder review may  be revised to meet current compliance requirements. Dr. Ulyess Mort Lin Landsman MD, MD 10/08/2018 3:03:58 PM This report has been signed electronically. Number of Addenda: 0 Note Initiated On: 10/08/2018 2:43 PM      [facility]

## 2018-10-08 NOTE — Anesthesia Preprocedure Evaluation (Signed)
Anesthesia Evaluation  Patient identified by MRN, date of birth, ID band Patient awake    Reviewed: Allergy & Precautions, H&P , NPO status , Patient's Chart, lab work & pertinent test results, reviewed documented beta blocker date and time   History of Anesthesia Complications Negative for: history of anesthetic complications  Airway Mallampati: II  TM Distance: >3 FB Neck ROM: full    Dental  (+) Dental Advidsory Given, Teeth Intact, Chipped   Pulmonary neg pulmonary ROS, former smoker,           Cardiovascular Exercise Tolerance: Good negative cardio ROS       Neuro/Psych PSYCHIATRIC DISORDERS Anxiety Depression negative neurological ROS     GI/Hepatic negative GI ROS, Neg liver ROS,   Endo/Other  negative endocrine ROS  Renal/GU negative Renal ROS  negative genitourinary   Musculoskeletal   Abdominal   Peds  Hematology negative hematology ROS (+)   Anesthesia Other Findings Past Medical History: No date: ADD (attention deficit disorder) No date: Anxiety No date: Depression No date: PTSD (post-traumatic stress disorder)   Reproductive/Obstetrics negative OB ROS                             Anesthesia Physical Anesthesia Plan  ASA: II  Anesthesia Plan: General   Post-op Pain Management:    Induction: Intravenous  PONV Risk Score and Plan: 3 and Propofol infusion and TIVA  Airway Management Planned: Nasal Cannula  Additional Equipment:   Intra-op Plan:   Post-operative Plan:   Informed Consent: I have reviewed the patients History and Physical, chart, labs and discussed the procedure including the risks, benefits and alternatives for the proposed anesthesia with the patient or authorized representative who has indicated his/her understanding and acceptance.   Dental Advisory Given  Plan Discussed with: Anesthesiologist, CRNA and Surgeon  Anesthesia Plan Comments:          Anesthesia Quick Evaluation

## 2018-10-08 NOTE — Anesthesia Postprocedure Evaluation (Signed)
Anesthesia Post Note  Patient: Tara Grant  Procedure(s) Performed: ESOPHAGOGASTRODUODENOSCOPY (EGD) WITH PROPOFOL (N/A )  Patient location during evaluation: Endoscopy Anesthesia Type: General Level of consciousness: awake and alert Pain management: pain level controlled Vital Signs Assessment: post-procedure vital signs reviewed and stable Respiratory status: spontaneous breathing and respiratory function stable Cardiovascular status: stable Anesthetic complications: no     Last Vitals:  Vitals:   10/08/18 1530 10/08/18 1538  BP:  98/69  Pulse: 71 66  Resp: 16 17  Temp:    SpO2: 100% 100%    Last Pain:  Vitals:   10/08/18 1508  TempSrc: Tympanic                 Kyonna Frier K

## 2018-10-08 NOTE — Anesthesia Procedure Notes (Signed)
Performed by: Maryjo Ragon, CRNA Pre-anesthesia Checklist: Patient identified, Emergency Drugs available, Suction available, Patient being monitored and Timeout performed Patient Re-evaluated:Patient Re-evaluated prior to induction Oxygen Delivery Method: Nasal cannula Induction Type: IV induction       

## 2018-10-08 NOTE — Anesthesia Post-op Follow-up Note (Signed)
Anesthesia QCDR form completed.        

## 2018-10-08 NOTE — Transfer of Care (Signed)
Immediate Anesthesia Transfer of Care Note  Patient: Tara Grant  Procedure(s) Performed: ESOPHAGOGASTRODUODENOSCOPY (EGD) WITH PROPOFOL (N/A )  Patient Location: PACU  Anesthesia Type:General  Level of Consciousness: drowsy and responds to stimulation  Airway & Oxygen Therapy: Patient Spontanous Breathing and Patient connected to nasal cannula oxygen  Post-op Assessment: Report given to RN and Post -op Vital signs reviewed and stable  Post vital signs: Reviewed and stable  Last Vitals:  Vitals Value Taken Time  BP 88/60 10/08/2018  3:08 PM  Temp    Pulse 88 10/08/2018  3:08 PM  Resp 18 10/08/2018  3:08 PM  SpO2 100 % 10/08/2018  3:08 PM    Last Pain: There were no vitals filed for this visit.       Complications: No apparent anesthesia complications

## 2018-10-08 NOTE — H&P (Signed)
Arlyss Repress, MD [address]  [address]  [place], [place] [address]  Main: [phone]  Fax: [phone] Pager: [phone]  Primary Care Physician:  Galen Manila, NP Primary Gastroenterologist:  Dr. Arlyss Repress  Pre-Procedure History & Physical: HPI:  Tara Grant is a 37 y.o. female is here for an endoscopy.   Past Medical History:  Diagnosis Date  . ADD (attention deficit disorder)   . Anxiety   . Depression   . PTSD (post-traumatic stress disorder)     Past Surgical History:  Procedure Laterality Date  . ankle arthroscopic synovectomy with osteochondral drilling/microfracture of the talar dome lesion Left 02/2018  . APPENDECTOMY  2011  . LEEP  [id number]    Prior to Admission medications   Medication Sig Start Date End Date Taking? Authorizing Provider  amphetamine-dextroamphetamine (ADDERALL) 5 MG tablet Take 1 tablet (5 mg total) by mouth daily. Patient taking differently: Take 5 mg by mouth as needed (only taking it Monday, Wednesday, and Friday).  08/08/18   Galen Manila, NP  azelastine (OPTIVAR) 0.05 % ophthalmic solution  08/23/18   [provider]  B Complex-C (B-COMPLEX WITH VITAMIN C) tablet Take 1 tablet by mouth daily.    [provider]  buPROPion (WELLBUTRIN XL) 300 MG 24 hr tablet Take 1 tablet (300 mg total) by mouth daily. 03/13/18   Galen Manila, NP  EPINEPHrine 0.3 mg/0.3 mL IJ SOAJ injection  08/23/18   [provider]  fluticasone Aleda Grana) 50 MCG/ACT nasal spray  08/23/18   [provider]  levocetirizine (XYZAL) 5 MG tablet  08/23/18   [provider]  meloxicam (MOBIC) 15 MG tablet Take 1 tablet (15 mg total) by mouth daily. 10/01/18   Felecia Shelling, DPM  mometasone (ELOCON) 0.1 % cream  08/23/18   [provider]  Omega 3 1200 MG CAPS Take by mouth.    [provider]  Saccharomyces boulardii (PROBIOTIC) 250 MG CAPS Take by mouth.    [provider]    Allergies as of 09/30/2018 - Review Complete 09/30/2018  Allergen Reaction Noted  . Amoxicillin Itching 02/25/2016  . Dairy aid [lactase] Nausea And Vomiting 02/25/2016  . Gluten meal Hives 02/25/2016  . Oxycodone Itching 02/25/2016  . Sulfamethoxazole-trimethoprim Other (See Comments) 02/25/2016    Family History  Problem Relation Age of Onset  . Depression Mother   . Mental illness Father   . Depression Sister     Social History   Socioeconomic History  . Marital status: Single    Spouse name: Not on file  . Number of children: Not on file  . Years of education: Not on file  . Highest education level: Not on file  Occupational History  . Not on file  Social Needs  . Financial resource strain: Not on file  . Food insecurity:    Worry: Not on file    Inability: Not on file  . Transportation needs:    Medical: Not on file    Non-medical: Not on file  Tobacco Use  . Smoking status: Former Smoker    Types: Cigarettes    Last attempt to quit: 01/11/2017    Years since quitting: 1.7  . Smokeless tobacco: Never Used  Substance and Sexual Activity  . Alcohol use: No  . Drug use: No  . Sexual activity: Not on file  Lifestyle  . Physical activity:    Days per week: Not on file    Minutes  per session: Not on file  . Stress: Not on file  Relationships  . Social connections:    Talks on phone: Not on file    Gets together: Not on file    Attends religious service: Not on file    Active member of club or organization: Not on file    Attends meetings of clubs or organizations: Not on file    Relationship status: Not on file  . Intimate partner violence:    Fear of current or ex partner: Not on file    Emotionally abused: Not on file    Physically abused: Not on file    Forced sexual activity: Not on file  Other Topics Concern  . Not on file  Social History Narrative  . Not on file    Review of Systems: See HPI, otherwise negative  ROS  Physical Exam: LMP 10/05/2018 (Exact Date)  General:   Alert,  pleasant and cooperative in NAD Head:  Normocephalic and atraumatic. Neck:  Supple; no masses or thyromegaly. Lungs:  Clear throughout to auscultation.    Heart:  Regular rate and rhythm. Abdomen:  Soft, nontender and nondistended. Normal bowel sounds, without guarding, and without rebound.   Neurologic:  Alert and  oriented x4;  grossly normal neurologically.  Impression/Plan: [name] is here for an endoscopy to be performed for abdominal bloating  Risks, benefits, limitations, and alternatives regarding  endoscopy have been reviewed with the patient.  Questions have been answered.  All parties agreeable.   Lannette Donathohini Brock Larmon, MD  10/08/2018, 3:07 PM

## 2018-10-09 ENCOUNTER — Encounter: Payer: Self-pay | Admitting: Gastroenterology

## 2018-10-10 LAB — SURGICAL PATHOLOGY

## 2018-11-07 ENCOUNTER — Encounter: Payer: Self-pay | Admitting: Nurse Practitioner

## 2018-11-07 ENCOUNTER — Ambulatory Visit (INDEPENDENT_AMBULATORY_CARE_PROVIDER_SITE_OTHER): Payer: BLUE CROSS/BLUE SHIELD | Admitting: Nurse Practitioner

## 2018-11-07 VITALS — BP 97/49 | HR 69 | Temp 98.5°F | Resp 16 | Ht 60.0 in | Wt 136.0 lb

## 2018-11-07 DIAGNOSIS — N9089 Other specified noninflammatory disorders of vulva and perineum: Secondary | ICD-10-CM | POA: Diagnosis not present

## 2018-11-07 NOTE — Patient Instructions (Addendum)
Tara Grant,   Thank you for coming in to clinic today.  1. Reduce perineal irritation.  2. May consider sitz bath twice daily 3. Continue warm compresses 4. If wound opens, may use mupirocin twice daily for 5 days.  No need to use now.  Alpha gal allergy foods to avoid: - beef - pork - lamb - organ meats (ie, kidney) - gelatins in foods - milk   Please schedule a follow-up appointment with Tara Grant, AGNP. Return if symptoms worsen or fail to improve.  If you have any other questions or concerns, please feel free to call the clinic or send a message through MyChart. You may also schedule an earlier appointment if necessary.  You will receive a survey after today's visit either digitally by e-mail or paper by [employer]. Your experiences and feedback matter to [place].  Please respond so we know how we are doing as we provide care for you.   Tara Mcardle, DNP, AGNP-BC Adult Gerontology Nurse Practitioner [facility], [facility]

## 2018-11-07 NOTE — Progress Notes (Signed)
Subjective:    Patient ID: Tara BellowAnita Grant, female    DOB: 02-Apr-1981, 38 y.o.   MRN: [id number]  Tara Grant is a 38 y.o. 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 Patient has had perineal abscesses in past.  Started using mupirocin in last 3 days.  Started warm compresses today.   - patient noted she had events over last couple weeks to month with significant diarrhea.  Has alpha gal food allergies and was not careful about allergens over holidays.  She had diarrhea subsequently and then started having fissures. She has since avoided known food allergens and has cut out sugars and has noted some improvement and only has one remaining cyst.  Previously had 3 lumps.   Has also had recent sauna use. - No recent intercourse    Social History   Tobacco Use  . Smoking status: Former Smoker    Types: Cigarettes    Last attempt to quit: 01/11/2017    Years since quitting: 1.8  . Smokeless tobacco: Never Used  Substance Use Topics  . Alcohol use: No  . Drug use: No    Review of Systems Per HPI unless specifically indicated above     Objective:    BP (!) 97/49   Pulse 69   Temp 98.5 F (36.9 C) (Oral)   Resp 16   Ht 5' (1.524 m)   Wt 136 lb (61.7 kg)   BMI 26.56 kg/m   Wt Readings from Last 3 Encounters:  11/07/18 136 lb (61.7 kg)  09/30/18 129 lb 6.4 oz (58.7 kg)  09/09/18 128 lb 6.4 oz (58.2 kg)    Physical Exam Vitals signs reviewed.  Constitutional:      General: She is not in acute distress.    Appearance: She is well-developed.  HENT:     Head: Normocephalic and atraumatic.  Cardiovascular:     Rate and Rhythm: Normal rate and regular rhythm.     Pulses:          Radial pulses are 2+ on the right side and 2+ on the left side.       Posterior tibial pulses are 1+ on the right side and 1+ on the left side.     Heart sounds: Normal heart sounds, S1 normal and S2 normal.    Pulmonary:     Effort: Pulmonary effort is normal. No respiratory distress.     Breath sounds: Normal breath sounds and air entry.  Genitourinary:      Comments: Cysts without erythema, drainage or other complication. Musculoskeletal:     Right lower leg: No edema.     Left lower leg: No edema.  Skin:    General: Skin is warm and dry.     Capillary Refill: Capillary refill takes less than 2 seconds.  Neurological:     Mental Status: She is alert and oriented to person, place, and time.  Psychiatric:        Attention and Perception: Attention normal.        Mood and Affect: Mood and affect normal.        Behavior: Behavior normal. Behavior is cooperative.        Assessment & Plan:   Problem List Items Addressed This Visit    None    Visit Diagnoses    Perineal cyst in female    -  Primary     Acute  perineal cyst without complication.  Continue to apply warm compresses. STOP using mupirocin ointment unless wound opens.  Then may use twice daily for 5-7 days. - Continue avoiding diarrhea which predisposes patient to microscopic breakdown of skin.   - Follow-up prn.   Follow up plan: Return if symptoms worsen or fail to improve.  Wilhelmina McardleLauren Malena Timpone, DNP, AGPCNP-BC Adult Gerontology Primary Care Nurse Practitioner [facility] 11/07/2018, 3:28 PM

## 2018-11-10 ENCOUNTER — Encounter: Payer: Self-pay | Admitting: Nurse Practitioner

## 2018-11-14 ENCOUNTER — Encounter: Payer: Self-pay | Admitting: Gastroenterology

## 2018-11-14 ENCOUNTER — Ambulatory Visit: Payer: BLUE CROSS/BLUE SHIELD | Admitting: Gastroenterology

## 2018-11-14 VITALS — BP 110/66 | HR 84 | Resp 17 | Ht 60.0 in | Wt 127.4 lb

## 2018-11-14 DIAGNOSIS — Z91018 Allergy to other foods: Secondary | ICD-10-CM | POA: Diagnosis not present
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
## 2018-11-29 ENCOUNTER — Telehealth: Payer: Self-pay | Admitting: Family Medicine

## 2018-11-29 NOTE — Telephone Encounter (Signed)
Pt needs a refill on dextroamp sent to [facility].  Her call back [phone]

## 2018-12-02 NOTE — Telephone Encounter (Signed)
The pt called requesting a refill of her Adderall. Please advise

## 2018-12-02 NOTE — Telephone Encounter (Signed)
I know I have recently seen her, but we did not discuss ADHD.  Patient will require an appointment.

## 2018-12-02 NOTE — Telephone Encounter (Signed)
The pt stop by the office and scheduled an appt to address ADHD.

## 2018-12-02 NOTE — Telephone Encounter (Signed)
Attempted to contact the pt. No answer. LMOM to return my call. 

## 2018-12-04 ENCOUNTER — Other Ambulatory Visit: Payer: Self-pay

## 2018-12-04 ENCOUNTER — Encounter: Payer: Self-pay | Admitting: Nurse Practitioner

## 2018-12-04 ENCOUNTER — Ambulatory Visit (INDEPENDENT_AMBULATORY_CARE_PROVIDER_SITE_OTHER): Payer: BLUE CROSS/BLUE SHIELD | Admitting: Nurse Practitioner

## 2018-12-04 VITALS — BP 97/55 | HR 65 | Temp 98.2°F | Ht 60.0 in | Wt 124.2 lb

## 2018-12-04 DIAGNOSIS — F902 Attention-deficit hyperactivity disorder, combined type: Secondary | ICD-10-CM | POA: Diagnosis not present

## 2018-12-04 DIAGNOSIS — J301 Allergic rhinitis due to pollen: Secondary | ICD-10-CM | POA: Diagnosis not present

## 2018-12-04 MED ORDER — AMPHETAMINE-DEXTROAMPHETAMINE 5 MG PO TABS: ORAL_TABLET | ORAL | 0 refills | Status: DC

## 2018-12-04 MED ORDER — FLUTICASONE PROPIONATE 50 MCG/ACT NA SUSP: 2.0000 | Freq: Every day | NASAL | 2 refills | Status: AC

## 2018-12-04 NOTE — Progress Notes (Signed)
Subjective:    Patient ID: Tara BellowAnita Grant, female    DOB: 05/24/1981, 38 y.o.   MRN: [id number]  Tara Grant is a 38 y.o. female presenting on 12/04/2018 for ADHD   HPI ADHD Patient returns for regular follow-up of ADHD and refills.   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  Patient doesn't like taking medication every day.   Patient doesn't want to become dependent on it.   - Doesn't like HR increase/changes and mild headache when first taking.  Patient had extended duration that prevented sleep with Adderall ER. - Continues working some with Tara Grant - Not specifically about ADHD behavioral therapies, however. Notes [facility]  Social History   Tobacco Use  . Smoking status: Former Smoker    Types: Cigarettes    Last attempt to quit: 01/11/2017    Years since quitting: 1.8  . Smokeless tobacco: Never Used  Substance Use Topics  . Alcohol use: No  . Drug use: No    Review of Systems Per HPI unless specifically indicated above     Objective:    BP (!) 97/55 (BP Location: Left Arm, Patient Position: Sitting, Cuff Size: Normal)   Pulse 65   Temp 98.2 F (36.8 C) (Oral)   Ht 5' (1.524 m)   Wt 124 lb 3.2 oz (56.3 kg)   BMI 24.26 kg/m   Wt Readings from Last 3 Encounters:  12/04/18 124 lb 3.2 oz (56.3 kg)  11/14/18 127 lb 6.4 oz (57.8 kg)  11/07/18 136 lb (61.7 kg)    Physical Exam Vitals signs reviewed.  Constitutional:      General: She is not in acute distress.    Appearance: She is well-developed.  HENT:     Head: Normocephalic and atraumatic.  Cardiovascular:     Rate and Rhythm: Normal rate and regular rhythm.     Pulses:          Radial pulses are 2+ on the right side and 2+ on the left side.       Posterior tibial pulses are 1+ on the right side and 1+ on the left side.     Heart sounds: Normal heart sounds, S1 normal and S2 normal.  Pulmonary:     Effort: Pulmonary effort is normal.  No respiratory distress.     Breath sounds: Normal breath sounds and air entry.  Musculoskeletal:     Right lower leg: No edema.     Left lower leg: No edema.  Skin:    General: Skin is warm and dry.     Capillary Refill: Capillary refill takes less than 2 seconds.  Neurological:     Mental Status: She is alert and oriented to person, place, and time.     GCS: GCS eye subscore is 4. GCS verbal subscore is 5. GCS motor subscore is 6.     Cranial Nerves: Cranial nerves are intact.     Sensory: Sensation is intact.     Motor: Motor function is intact. No tremor.     Coordination: Coordination is intact.     Gait: Gait is intact.  Psychiatric:        Attention and Perception: Perception normal. She is inattentive.        Mood and Affect: Mood and affect normal.        Speech: She is communicative. Speech is rapid and pressured and tangential.  Behavior: Behavior is hyperactive. Behavior is cooperative.        Thought Content: Thought content normal. Thought content does not include homicidal or suicidal ideation. Thought content does not include homicidal or suicidal plan.        Cognition and Memory: Cognition and memory normal.        Judgment: Judgment normal.       Assessment & Plan:   Problem List Items Addressed This Visit      Respiratory   Seasonal allergic rhinitis due to pollen  Patient requests refill of flonase due to increasing sinus congestion. Refill provided. Follow-up 6 months.   Relevant Medications   fluticasone (FLONASE) 50 MCG/ACT nasal spray     Other   ADD (attention deficit disorder) Stable and well controlled on days patient takes Adderall.  Patient tolerating without side effects except early morning increased HR.  Did not tolerate extended release in past.  Plan: 1. Continue Adderall 5 mg once daily for inattention.  Patient does not take daily if not needed, so will provide only #20 tabs per 30-day fill. 2. Encouraged patient to consider  additional work with therapist on behavioral management of ADD symptoms. 3. Follow-up 3 months.    Relevant Medications   amphetamine-dextroamphetamine (ADDERALL) 5 MG tablet (Start on 02/04/2019)   amphetamine-dextroamphetamine (ADDERALL) 5 MG tablet (Start on 01/04/2019)   amphetamine-dextroamphetamine (ADDERALL) 5 MG tablet      Meds ordered this encounter  Medications  . fluticasone (FLONASE) 50 MCG/ACT nasal spray    Sig: Place 2 sprays into both nostrils daily.    Dispense:  16 g    Refill:  2  . amphetamine-dextroamphetamine (ADDERALL) 5 MG tablet    Sig: Take 5 mg once daily up to four times per week when working or needing increased focus.    Dispense:  20 tablet    Refill:  0    Order Specific Question:   Supervising Provider    Answer:   Smitty Cords [[id number]]  . amphetamine-dextroamphetamine (ADDERALL) 5 MG tablet    Sig: Take 5 mg once daily up to four times per week when working or needing increased focus.    Dispense:  20 tablet    Refill:  0    Order Specific Question:   Supervising Provider    Answer:   Smitty Cords [[id number]]  . amphetamine-dextroamphetamine (ADDERALL) 5 MG tablet    Sig: Take 5 mg once daily up to four times per week when working or needing increased focus.    Dispense:  20 tablet    Refill:  0    Order Specific Question:   Supervising Provider    Answer:   Smitty Cords [[id number]]    Follow up plan: Return in about 3 months (around 03/04/2019) for ADHD.  Tara Mcardle, DNP, AGPCNP-BC Adult Gerontology Primary Care Nurse Practitioner [facility] 12/04/2018, 8:47 AM

## 2018-12-04 NOTE — Patient Instructions (Addendum)
Robert BellowAnita Schwebach,   Thank you for coming in to clinic today.  Continue current prescription.  You have 3 separate [facility] fills [facility] [address]  Please schedule a follow-up appointment with Wilhelmina McardleLauren Danique Hartsough, AGNP. Return in about 3 months (around 03/04/2019) for ADHD.  If you have any other questions or concerns, please feel free to call the clinic or send a message through MyChart. You may also schedule an earlier appointment if necessary.  You will receive a survey after today's visit either digitally by e-mail or paper by [employer] mail. Your experiences and feedback matter to us.  Please respond so we know how we are doing as we provide care for you.   Wilhelmina McardleLauren Naveya Ellerman, DNP, AGNP-BC Adult Gerontology Nurse Practitioner [facility]

## 2018-12-06 ENCOUNTER — Encounter: Payer: Self-pay | Admitting: Nurse Practitioner

## 2019-02-13 ENCOUNTER — Encounter: Payer: Self-pay | Admitting: Dietician

## 2019-02-13 ENCOUNTER — Encounter: Payer: BLUE CROSS/BLUE SHIELD | Attending: Obstetrics and Gynecology | Admitting: Dietician

## 2019-02-13 ENCOUNTER — Other Ambulatory Visit: Payer: Self-pay

## 2019-02-13 VITALS — Ht 61.0 in | Wt 119.5 lb

## 2019-02-13 DIAGNOSIS — Z91018 Allergy to other foods: Secondary | ICD-10-CM

## 2019-02-13 NOTE — Patient Instructions (Addendum)
Balance meals with 1-3 oz protein, 2-3 servings of carbohydrate and non-starchy vegetables. Include 6-7 oz protein foods (refer to list)to contribute to a total of 65-75 gms of protein daily. Continue to limit alcohol. Include more sources of calcium with goal of 1000 mg/day. Include 25 gms fiber per day with goal of 10 gms soluble fiber Continue with current restrictions due to food allergies.

## 2019-02-13 NOTE — Progress Notes (Signed)
Medical Nutrition Therapy: Visit start time: 9:00 am  end time: 10:30am  Assessment:  Diagnosis: multiple food allergies (milk IgE Class 0/1; Wheat IgE Class 0/1; beef IgE .43 kU/L elevated above reference range (less than .35 kU/L  Past medical history: anxiety, depression, anal fissure  Psychosocial issues/ stress concerns:Patient reports a long term history of depression. She meets regularly with a therapist and takes medication as part of her treatment.  Preferred learning method:  . Auditory . Visual  Current weight: 119.5 lbs  Height: 61 in  BMI: 22.6 Medications, supplements: see list  Progress and evaluation:  Patient in for initial medical nutrition therapy assessment.  She reports that since eliminating most of the foods that she tested a slight allergic reaction to ( wheat, milk products, beef),most of her symptoms of hives and GI symptoms have improved significantly.  She states that due to low level of immune test response to wheat, dairy and beef, her MD said she could occasionally have small amounts of these foods. She does carry an Epi-pen with her. Due to diet changes she has made, she reports a weight loss of approximately 15 lbs. She is comfortable with her present weight of 119.5. She states my main concern is the irritation that the anal fissure causes and desire to increase her immune system and decrease inflammation (she relates to history of staph infections) in the body. She reports she has 1-2 stools daily and she typically has to strain. She has eliminated most sweets and sweetened beverage in order to decrease sugar intake which she relates to staph infections. She also limits some starches and fruits because "they turn to sugar". Her protein sources are eggs, chicken, fish, nuts, black beans and almond butter.  She sometimes adds a protein powder to hemp milk. Her protein intake may be low in days that she does not include a meat for dinner.  She includes 3-4  servings of vegetables daily. Her calcium intake is most likely low with elimination of dairy foods.  Physical activity: no structured exercise Dietary Intake:  Usual eating pattern includes 2- 3 meals and 1-2 snacks per day. Dining out frequency: 2 meals per week.  Breakfast: 7-9:00- sometimes skips. Drinks coffee with hemp milk Lunch: 11-1:00pm- banana sandwich with GF bread or homemade soup with chicken broth and vegetables Snack: carrots,guacomole , occas. cheetos Supper: 6-8:00pm- vegetables (loves a variety); eats 3-5 oz chicken or fish 3-4 nights per week) Snack: 1 oz dark chocolate Beverages: water- 2-3 quarts daily; 1- 2 (10-12 oz ) cups coffee with hemp milk  Nutrition Care Education:  Food Allergies:  Reviewed her allergy lab work and foods to limit avoid/limit. Patient has a good understanding of foods to avoid or limit. When she eats a small amount of foods she tested positive for, she has not had any type of reaction.  Basic nutrition:  Used food guide plate and Planning a Balance Meal hand-out to instruct on protein, carbohydrate, healthy fats and non-starchy carbohydrates and recommended balance at meals and snacks. Commended on the positive diet changes she has made and by doing this she has increased her intake of anti-inflammatory foods (fruits, vegetables, omega -3 fats and unsaturated fats) and decreased pro-inflammatory foods (sweets, red meats) Instructed on recommendations for protein and role in immune system health. Gave and instructed on sources of protein. Also, instructed on recommendations for calcium and a list of non-dairy calcium food sources. Also, gave and reviewed list of foods and fiber content with emphasis on  soluble fiber rather than non-soluble fiber to help form bulk in stools. Gave recommended list of multi-vitamins brands  that meet USP guidelines as the supplement she is taking contains no calcium for example.   Discussed how structured exercise  will help her body use nutrients more efficiently.  Nutritional Diagnosis:  NB-2.1 Physical inactivity As related to no structured exercise.  As evidenced by exercise history.. NI-5.11.1 Predicted suboptimal nutrient intake As related to low intake of calcium foods most days and protein on some days.  As evidenced by deit history..  Intervention:  Balance meals with 1-3 oz protein, 2-3 servings of carbohydrate and non-starchy vegetables. Include 6-7 oz protein foods (refer to list)to contribute to a total of 65-75 gms of protein daily. Continue to limit alcohol. Include more sources of calcium with goal of 1000 mg/day. Include 25 gms fiber per day with goal of 10 gms soluble fiber Continue with current restrictions due to food allergies.  Education Materials given:  .  Marland Kitchen. Plate Planner . Food lists/ Planning A Balanced Meal . Sample meal pattern/ menus . List of sources of soluble fiber . List of foods and calcium content. . Goals/ instructions . How to choose a multi-vitamin and examples of brands of vitamins that meet recommended guidelines  Learner/ who was taught:  . Patient  Level of understanding:  Verbalized understanding Demonstrated degree of understanding via:   Teach back Learning barriers: . None Willingness to learn/ readiness for change: . Eager, change in progress  Monitoring and Evaluation:  No follow-up was scheduled at this time. Patient was encouraged to call if she desires additional help with her diet/nutrition.

## 2019-03-07 ENCOUNTER — Encounter: Payer: Self-pay | Admitting: Family Medicine

## 2019-03-07 ENCOUNTER — Telehealth: Payer: Self-pay

## 2019-03-07 ENCOUNTER — Ambulatory Visit (INDEPENDENT_AMBULATORY_CARE_PROVIDER_SITE_OTHER): Payer: BLUE CROSS/BLUE SHIELD | Admitting: Family Medicine

## 2019-03-07 ENCOUNTER — Other Ambulatory Visit: Payer: Self-pay

## 2019-03-07 DIAGNOSIS — F902 Attention-deficit hyperactivity disorder, combined type: Secondary | ICD-10-CM | POA: Diagnosis not present

## 2019-03-07 MED ORDER — AMPHETAMINE-DEXTROAMPHETAMINE 5 MG PO TABS: ORAL_TABLET | ORAL | 0 refills | Status: DC

## 2019-03-07 NOTE — Telephone Encounter (Signed)
Open in error

## 2019-03-07 NOTE — Patient Instructions (Addendum)
Refilled Adderall 5mg  medication today, 3 separate rx - one rx for 20 pills each month  First fill dates:  03/10/19 04/07/19 05/07/19  Call back to schedule w/ Lauren in 3 months to discuss dosage.  Please schedule a Follow-up Appointment to: Return in about 3 months (around 06/07/2019) for ADHD refills w/ PCP.  If you have any other questions or concerns, please feel free to call the office or send a message through MyChart. You may also schedule an earlier appointment if necessary.  Additionally, you may be receiving a survey about your experience at our office within a few days to 1 week by e-mail or mail. We value your feedback.  Saralyn Pilar, DO [facility], [place]

## 2019-03-07 NOTE — Progress Notes (Signed)
Virtual Visit via Telephone The purpose of this virtual visit is to provide medical care while limiting exposure to the novel coronavirus (COVID19) for both patient and office staff.  Consent was obtained for phone visit:  Yes.   Answered questions that patient had about telehealth interaction:  Yes.   I discussed the limitations, risks, security and privacy concerns of performing an evaluation and management service by telephone. I also discussed with the patient that there may be a patient responsible charge related to this service. The patient expressed understanding and agreed to proceed.  Patient Location: Home Provider Location: [facility] (Office)  PCP is Wilhelmina Mcardle, AGPCNP-BC - I am currently covering during her maternity leave.   ---------------------------------------------------------------------- Chief Complaint  Patient presents with  . ADHD    pt currently trying to manage her Adderrall by only taking it 2-3 times a week    S: Reviewed CMA documentation. I have called patient and gathered additional HPI as follows:  Follow-up ADHD, predominantly inattentive Chronic history of ADHD, regularly follows w/ PCP for med refills q 3 months, last visit 11/2018. 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 Denies any known or suspected exposure to person with or possibly with COVID19.  Denies any fevers, chills, sweats, body ache, cough, shortness of breath, sinus pain or pressure, headache, abdominal pain, diarrhea  Past Medical History:  Diagnosis Date  . ADD (attention deficit disorder)   . Anxiety   . Depression   . PTSD (post-traumatic stress disorder)    Social History   Tobacco Use  . Smoking status: Former Smoker    Types: Cigarettes    Last attempt to quit: 01/11/2017    Years since quitting: 2.1  . Smokeless tobacco: Never Used  Substance Use Topics  . Alcohol use: No  . Drug use: No    Current Outpatient Medications:  .  acyclovir (ZOVIRAX) 800 MG tablet, Take 800 mg by mouth 2 (two) times daily., Disp: , Rfl:  .  B Complex-C (B-COMPLEX WITH VITAMIN C) tablet, Take 1 tablet by mouth daily., Disp: , Rfl:  .  buPROPion (WELLBUTRIN XL) 300 MG 24 hr tablet, Take 1 tablet (300 mg total) by mouth daily., Disp: 30 tablet, Rfl: 11 .  EPINEPHrine 0.3 mg/0.3 mL IJ SOAJ injection, , Disp: , Rfl: 0 .  fluticasone (FLONASE) 50 MCG/ACT nasal spray, Place 2 sprays into both nostrils daily., Disp: 16 g, Rfl: 2 .  levocetirizine (XYZAL) 5 MG tablet, , Disp: , Rfl: 2 .  Lysine 1000 MG TABS, Take by mouth., Disp: , Rfl:  .  mometasone (ELOCON) 0.1 % cream, , Disp: , Rfl: 0 .  Multiple Vitamin (MULTIVITAMIN) tablet, Take 1 tablet by mouth daily., Disp: ,  Rfl:  .  Omega 3 1200 MG CAPS, Take by mouth., Disp: , Rfl:  .  Saccharomyces boulardii (PROBIOTIC) 250 MG CAPS, Take by mouth., Disp: , Rfl:  .  [START ON 05/07/2019] amphetamine-dextroamphetamine (ADDERALL) 5 MG tablet, Take 5 mg once daily up to four times per week when working or needing increased focus., Disp: 20 tablet, Rfl: 0 .  [START ON 04/07/2019] amphetamine-dextroamphetamine (ADDERALL) 5 MG tablet, Take 5 mg once daily up to four times per week when working or needing increased focus., Disp: 20 tablet, Rfl:  0 .  [START ON 03/10/2019] amphetamine-dextroamphetamine (ADDERALL) 5 MG tablet, Take 5 mg once daily up to four times per week when working or needing increased focus., Disp: 20 tablet, Rfl: 0 .  azelastine (OPTIVAR) 0.05 % ophthalmic solution, , Disp: , Rfl: 2  [facility] 03/07/2019 03/07/2019 02/13/2019  Decreased Interest 0 0 1  Down, Depressed, Hopeless 1 1 1   PHQ - 2 Score 1 1 2   Altered sleeping 3 - 2  Tired, decreased energy 1 - 1  Change in appetite 2 - 2  Feeling bad or failure about yourself  1 - 1  Trouble concentrating 3 - 3  Moving slowly or fidgety/restless 0 - 0  Suicidal thoughts 0 - 0  PHQ-9 Score 11 - 11  Difficult doing work/chores Extremely dIfficult - Somewhat difficult  Some recent data might be hidden    GAD 7 : Generalized Anxiety Score 03/07/2019 08/08/2018 04/11/2017 03/13/2017  Nervous, Anxious, on Edge 3 2 1 3   Control/stop worrying 2 2 1 3   Worry too much - different things 1 2 1 3   Trouble relaxing 1 3 2 3   Restless 3 3 1 3   Easily annoyed or irritable 1 2 2 3   Afraid - awful might happen 0 1 0 1  Total GAD 7 Score 11 15 8 19   Anxiety Difficulty Extremely difficult Somewhat difficult - Somewhat difficult    -------------------------------------------------------------------------- O: No physical exam performed due to remote telephone encounter.  Lab results reviewed.  No results found for this or any previous visit (from the past 2160 hour(s)).  -------------------------------------------------------------------------- A&P:  Problem List Items Addressed This Visit    ADD (attention deficit disorder)   Relevant Medications   amphetamine-dextroamphetamine (ADDERALL) 5 MG tablet (Start on 05/07/2019)   amphetamine-dextroamphetamine (ADDERALL) 5 MG tablet (Start on 04/07/2019)   amphetamine-dextroamphetamine (ADDERALL) 5 MG tablet (Start on 03/10/2019)     Clinically controlled ADD on medication Adderall low dosage intermittent  dosing Followed/managed by PCP for this issue. Also Followed by Psychology / therapist Karen [place] Check  [place] AWARE for 2 yr  Agree to refill current medication, according to plan from PCP last visit - x 3 separate rx - Adderall 5mg  IR take daily up to 4 times week when working or need inc focus, #20 count per rx, different fill dates 03/10/19, 04/07/19, 05/07/19  Meds ordered this encounter  Medications  . amphetamine-dextroamphetamine (ADDERALL) 5 MG tablet    Sig: Take 5 mg once daily up to four times per week when working or needing increased focus.    Dispense:  20 tablet    Refill:  0    First fill date 05/07/2019  . amphetamine-dextroamphetamine (ADDERALL) 5 MG tablet    Sig: Take 5 mg once daily up to four times per week when working or needing increased focus.    Dispense:  20 tablet    Refill:  0    First  fill date 04/07/2019  . amphetamine-dextroamphetamine (ADDERALL) 5 MG tablet    Sig: Take 5 mg once daily up to four times per week when working or needing increased focus.    Dispense:  20 tablet    Refill:  0    First fill date 03/10/2019    Follow-up: - Return in 3 months for ADHD refills w/ PCP  Patient verbalizes understanding with the above medical recommendations including the limitation of remote medical advice.  Specific follow-up and call-back criteria were given for patient to follow-up or seek medical care more urgently if needed.   - Time spent in direct consultation with patient on phone: 12 minutes   Saralyn Pilar, DO [facility] 03/07/2019, 2:53 PM

## 2019-04-09 ENCOUNTER — Other Ambulatory Visit: Payer: Self-pay | Admitting: Nurse Practitioner

## 2019-04-09 DIAGNOSIS — F419 Anxiety disorder, unspecified: Secondary | ICD-10-CM

## 2019-04-09 DIAGNOSIS — F321 Major depressive disorder, single episode, moderate: Secondary | ICD-10-CM

## 2019-04-09 DIAGNOSIS — F4310 Post-traumatic stress disorder, unspecified: Secondary | ICD-10-CM

## 2019-09-02 ENCOUNTER — Ambulatory Visit (INDEPENDENT_AMBULATORY_CARE_PROVIDER_SITE_OTHER): Payer: BLUE CROSS/BLUE SHIELD | Admitting: Family Medicine

## 2019-09-02 ENCOUNTER — Encounter: Payer: Self-pay | Admitting: Family Medicine

## 2019-09-02 ENCOUNTER — Other Ambulatory Visit: Payer: Self-pay

## 2019-09-02 ENCOUNTER — Telehealth: Payer: Self-pay | Admitting: Nurse Practitioner

## 2019-09-02 DIAGNOSIS — J301 Allergic rhinitis due to pollen: Secondary | ICD-10-CM

## 2019-09-02 DIAGNOSIS — F902 Attention-deficit hyperactivity disorder, combined type: Secondary | ICD-10-CM

## 2019-09-02 DIAGNOSIS — F419 Anxiety disorder, unspecified: Secondary | ICD-10-CM

## 2019-09-02 DIAGNOSIS — F4310 Post-traumatic stress disorder, unspecified: Secondary | ICD-10-CM

## 2019-09-02 DIAGNOSIS — F321 Major depressive disorder, single episode, moderate: Secondary | ICD-10-CM

## 2019-09-02 MED ORDER — AMPHETAMINE-DEXTROAMPHETAMINE 5 MG PO TABS: ORAL_TABLET | ORAL | 0 refills | Status: AC

## 2019-09-02 MED ORDER — LEVOCETIRIZINE DIHYDROCHLORIDE 5 MG PO TABS: 5.0000 mg | ORAL_TABLET | Freq: Every evening | ORAL | 1 refills | Status: AC

## 2019-09-02 MED ORDER — BUPROPION HCL ER (XL) 300 MG PO TB24: 300.0000 mg | ORAL_TABLET | Freq: Every day | ORAL | 3 refills | Status: DC

## 2019-09-02 NOTE — Progress Notes (Signed)
Subjective:    Patient ID: Tara BellowAnita Grant, female    DOB: October 31, 1980, 38 y.o.   MRN: [id number]  Tara Bellownita Prete is a 38 y.o. 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 - Today patient reports she is maintaining currently but thinks there is room for improvement on dosing for her ADHD, primary issue is focusing on her work. She expresses other artistic interests she likes to pursue as well but has difficulty with some of her work with focus primarily. Improved on Adderall taking 5 mg IR dosing once daily now seems to wear off after lunch often, was taking it 3 x week with class, now she has class up to 5 x week and other obligations, she would like to increase frequency of dosing. Prior rx lasted >6 months. - She has multiple life stressors with family and work and caregiver for relative    [facility] 09/02/2019 03/07/2019 03/07/2019  Decreased Interest 1 0 0  Down, Depressed, Hopeless 2 1 1   PHQ - 2 Score 3 1 1   Altered sleeping 0 3 -  Tired, decreased energy 2 1 -  Change in appetite 1 2 -  Feeling bad or failure about yourself  2 1 -  Trouble concentrating 3 3 -  Moving slowly or fidgety/restless 2 0 -  Suicidal thoughts 0 0 -  PHQ-9 Score 13 11 -  Difficult doing work/chores Very difficult Extremely dIfficult -  Some recent data might be hidden    Social History   Tobacco Use  . Smoking status: Former Smoker    Types: Cigarettes    Quit date: 01/11/2017    Years since quitting: 2.6  . Smokeless tobacco: Former Engineer, waterUser  Substance Use Topics  . Alcohol use: No  . Drug use: No    Review of Systems  Per HPI unless specifically indicated above     Objective:    BP (!) 91/50   Pulse 67   Temp 98.8 F (37.1 C) (Oral)   Resp 16   Ht 5' (1.524 m)   Wt 123 lb (55.8 kg)   BMI 24.02 kg/m   Wt Readings from Last 3 Encounters:  09/02/19 123 lb (55.8 kg)  02/13/19 119 lb 8 oz (54.2 kg)  12/04/18 124 lb 3.2 oz (56.3 kg)    Physical Exam Vitals signs and nursing note reviewed.  Constitutional:      General: She is not in acute distress.    Appearance: She is well-developed. She is not diaphoretic.     Comments: Well-appearing, comfortable, cooperative  HENT:     Head: Normocephalic and atraumatic.  Eyes:     General:        Right eye: No discharge.        Left eye: No discharge.     Conjunctiva/sclera: Conjunctivae normal.  Cardiovascular:     Rate and Rhythm: Normal rate.  Pulmonary:     Effort: Pulmonary effort is normal.  Skin:    General: Skin is warm and dry.     Findings: No erythema or rash.  Neurological:     Mental Status: She is alert and  oriented to person, place, and time.  Psychiatric:        Behavior: Behavior normal.     Comments: Well groomed, good eye contact, normal speech and thoughts    Results for orders placed or performed during the hospital encounter of 10/08/18  Surgical pathology  Result Value Ref Range   SURGICAL PATHOLOGY      Surgical Pathology CASE: [id number] PATIENT: Tara Grant Surgical Pathology Report     SPECIMEN SUBMITTED: A. Duodenum, r/o celiac; cbx B. Stomach; cbx  CLINICAL HISTORY: None provided  PRE-OPERATIVE DIAGNOSIS: Dyspepsia R10.13  POST-OPERATIVE DIAGNOSIS: None provided.     DIAGNOSIS: A.  DUODENUM; COLD BIOPSY: - UNREMARKABLE DUODENAL MUCOSA WITH PRESERVED VILLOUS ARCHITECTURE. - NEGATIVE FOR DYSPLASIA AND MALIGNANCY.  B.  STOMACH; COLD BIOPSY: - ANTRAL AND OXYNTIC MUCOSA WITH MINIMAL NONSPECIFIC CHRONIC GASTRITIS. - NEGATIVE FOR H. PYLORI, DYSPLASIA, AND MALIGNANCY.  GROSS DESCRIPTION:  A. Labeled: C BX duodenum rule out celiac Received: In formalin Tissue fragment(s): 4 Size: Less than 0.1-0.5 cm Description: Tan Entirely submitted in 1 cassette.  B. Labeled: Gastric cbx Received: In formalin Tissue fragment(s): 4 Size: 0.2-0.5 cm Description: Tan Entirely submitted in 1 cassette.   Final Diagnosis performed by Quay Burow, MD.   Gladys Damme ctronically signed 10/10/2018 10:40:52AM The electronic signature indicates that the named Attending Pathologist has evaluated the specimen  Technical component performed at [facility], [address], [place], [place] [address] Lab: [phone] Dir: Rush Farmer, MD, MMM  Professional component performed at [facility], [facility], [place], [place], [place] [address] Lab: [phone] Dir: Dellia Nims. 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 Also Followed by Psychology / therapist Karen [place] Check [facility] PMP AWARE for 2 yr  Adjust rx - will continue current Adderall IR dosing with 5mg  but since not lasting whole work day will add available 2nd dose, may take up to BID dosing, up to 5 x per week (weekdays), increase pill count from 20 to #30, and x 3 separate rx sent, for goal of approx 3 month duration total, can be longer if winter break takes less med.  Meds ordered this encounter  Medications  . amphetamine-dextroamphetamine (ADDERALL) 5 MG tablet    Sig: Take 5 mg twice a day up to five times per  week when working or needing increased focus.    Dispense:  30 tablet    Refill:  0    First fill date 10/28/2019  . amphetamine-dextroamphetamine (ADDERALL) 5 MG tablet    Sig: Take 5 mg twice a day up to five times per week when working or needing increased focus.    Dispense:  30 tablet    Refill:  0    First fill date 09/30/2019  . amphetamine-dextroamphetamine (ADDERALL) 5 MG tablet    Sig: Take 5 mg twice a day up to five times per week when working or needing increased focus.    Dispense:  30 tablet    Refill:  0    First  fill date 09/02/2019    Follow up plan: Return in about 3 months (around 12/03/2019) for Follow-up 3 months ADHD refills w/ new provider.   Saralyn Pilar, DO [facility] 09/02/2019, 9:03 AM

## 2019-09-02 NOTE — Patient Instructions (Addendum)
Thank you for coming to the office today.  Now increased to 1 pill up to twice a day when needed for 5 days a week max.  3 separate rx - Fill dates today (09/02/19), 09/30/19, and 10/28/19 - cannot fill earlier than those dates.   Please schedule a Follow-up Appointment to: Return in about 3 months (around 12/03/2019) for Follow-up 3 months ADHD refills w/ new provider.  If you have any other questions or concerns, please feel free to call the office or send a message through [place]. You may also schedule an earlier appointment if necessary.  Additionally, you may be receiving a survey about your experience at our office within a few days to 1 week by e-mail or mail. We value your feedback.  Nobie Putnam, DO [place]

## 2019-09-02 NOTE — Telephone Encounter (Signed)
Pt called requesting refill on bupropion, levocetirzine

## 2020-01-28 ENCOUNTER — Other Ambulatory Visit: Payer: Self-pay | Admitting: Family Medicine

## 2020-01-28 DIAGNOSIS — F419 Anxiety disorder, unspecified: Secondary | ICD-10-CM

## 2020-01-28 DIAGNOSIS — F4310 Post-traumatic stress disorder, unspecified: Secondary | ICD-10-CM

## 2020-01-28 DIAGNOSIS — F321 Major depressive disorder, single episode, moderate: Secondary | ICD-10-CM

## 2020-02-14 ENCOUNTER — Ambulatory Visit: Payer: BLUE CROSS/BLUE SHIELD | Attending: Internal Medicine

## 2020-02-14 ENCOUNTER — Other Ambulatory Visit: Payer: Self-pay

## 2020-02-14 DIAGNOSIS — Z23 Encounter for immunization: Secondary | ICD-10-CM

## 2020-02-14 NOTE — Progress Notes (Addendum)
   Covid-19 Vaccination Clinic  Name:  Tara Grant    MRN: [id number] DOB: 1981-03-06  02/14/2020  Ms. [name] was observed post Covid-19 immunization for 30 minutes without incident. She was provided with Vaccine Information Sheet and instruction to access the V-Safe system.   Ms. [name] was instructed to call 911 with any severe reactions post vaccine: Marland Kitchen Difficulty breathing  . Swelling of face and throat  . A fast heartbeat  . A bad rash all over body  . Dizziness and weakness   Immunizations Administered    Name Date Dose VIS Date Route   Pfizer COVID-19 Vaccine 02/14/2020  9:43 AM 0.3 mL 12/17/2018 Intramuscular   Manufacturer: [employer], [employer]   Lot: [id number]   NDC: [address]-2

## 2020-03-09 ENCOUNTER — Ambulatory Visit: Payer: BLUE CROSS/BLUE SHIELD | Attending: Internal Medicine

## 2020-03-09 DIAGNOSIS — Z23 Encounter for immunization: Secondary | ICD-10-CM

## 2020-03-09 NOTE — Progress Notes (Signed)
   Covid-19 Vaccination Clinic  Name:  Tara Grant    MRN: [id number] DOB: Aug 31, 1981  03/09/2020  Ms. Carrier was observed post Covid-19 immunization for 30 minutes based on pre-vaccination screening without incident. She was provided with Vaccine Information Sheet and instruction to access the V-Safe system.   [name] was instructed to call 911 with any severe reactions post vaccine: Marland Kitchen Difficulty breathing  . Swelling of face and throat  . A fast heartbeat  . A bad rash all over body  . Dizziness and weakness   Immunizations Administered    Name Date Dose VIS Date Route   Pfizer COVID-19 Vaccine 03/09/2020  3:30 PM 0.3 mL 12/17/2018 Intramuscular   Manufacturer: [employer], [employer]   Lot: [id number]   NDC: [address]-9

## 2020-09-07 ENCOUNTER — Other Ambulatory Visit: Payer: Self-pay | Admitting: Family Medicine

## 2020-09-07 DIAGNOSIS — F419 Anxiety disorder, unspecified: Secondary | ICD-10-CM

## 2020-09-07 DIAGNOSIS — F4310 Post-traumatic stress disorder, unspecified: Secondary | ICD-10-CM

## 2020-09-07 DIAGNOSIS — F321 Major depressive disorder, single episode, moderate: Secondary | ICD-10-CM

## 2021-02-25 ENCOUNTER — Ambulatory Visit: Payer: BLUE CROSS/BLUE SHIELD | Admitting: Podiatry

## 2021-02-25 ENCOUNTER — Other Ambulatory Visit: Payer: Self-pay

## 2022-12-23 ENCOUNTER — Emergency Department
Admission: EM | Admit: 2022-12-23 | Discharge: 2022-12-23 | Disposition: A | Discharge status: Home / Self Care | Payer: BLUE CROSS/BLUE SHIELD | Attending: Emergency Medicine | Admitting: Emergency Medicine

## 2022-12-23 ENCOUNTER — Emergency Department: Payer: BLUE CROSS/BLUE SHIELD

## 2022-12-23 ENCOUNTER — Encounter: Payer: Self-pay | Admitting: Intensive Care

## 2022-12-23 ENCOUNTER — Other Ambulatory Visit: Payer: Self-pay

## 2022-12-23 DIAGNOSIS — W293XXA Contact with powered garden and outdoor hand tools and machinery, initial encounter: Secondary | ICD-10-CM | POA: Insufficient documentation

## 2022-12-23 DIAGNOSIS — Z23 Encounter for immunization: Secondary | ICD-10-CM | POA: Insufficient documentation

## 2022-12-23 DIAGNOSIS — S56522A Laceration of other extensor muscle, fascia and tendon at forearm level, left arm, initial encounter: Secondary | ICD-10-CM | POA: Diagnosis not present

## 2022-12-23 DIAGNOSIS — S59912A Unspecified injury of left forearm, initial encounter: Secondary | ICD-10-CM | POA: Diagnosis present

## 2022-12-23 MED ORDER — DOXYCYCLINE HYCLATE 100 MG PO TABS: 100.0000 mg | ORAL_TABLET | Freq: Two times a day (BID) | ORAL | 0 refills | Status: AC

## 2022-12-23 MED ORDER — HYDROCODONE-ACETAMINOPHEN 5-325 MG PO TABS: 1.0000 | ORAL_TABLET | Freq: Four times a day (QID) | ORAL | 0 refills | Status: AC | PRN

## 2022-12-23 MED ORDER — LIDOCAINE-EPINEPHRINE (PF) 2 %-1:200000 IJ SOLN
10.0000 mL | Freq: Once | INTRAMUSCULAR | Status: AC
  Administered 2022-12-23: 10 mL via INTRADERMAL
  Filled 2022-12-23: qty 20

## 2022-12-23 MED ORDER — TETANUS-DIPHTH-ACELL PERTUSSIS 5-2.5-18.5 LF-MCG/0.5 IM SUSY
0.5000 mL | PREFILLED_SYRINGE | Freq: Once | INTRAMUSCULAR | Status: AC
  Administered 2022-12-23: 0.5 mL via INTRAMUSCULAR
  Filled 2022-12-23: qty 0.5

## 2022-12-23 NOTE — Discharge Instructions (Signed)
Tell [facility] that you have a laceration of the tendon in the left forearm.  I am not sure if this is the extensor carpi ulnaris tendon but orthopedics will be able to tell you for sure.  If you have numbness tingling or any decreased range of motion could mean that the tendon is further tearing. Take the antibiotic as prescribed.  Tylenol for pain as needed or Vicodin for pain as needed. Wear the splint until evaluated by orthopedics Return if worsening

## 2022-12-23 NOTE — ED Notes (Signed)
Patient declined discharge vital signs. 

## 2022-12-23 NOTE — ED Notes (Signed)
Wet dressing placed on wound on Left forearm per Ashok Cordia PA-C

## 2022-12-23 NOTE — ED Notes (Signed)
Wound is approximately 3cm in length and 1 cm in width. Bleeding is controlled. Patient c/o dizziness after lido-epi administration. Head of the bed lowered, patient placed on dynamap.

## 2022-12-23 NOTE — ED Provider Notes (Signed)
[facility] Provider Note    Event Date/Time   First MD Initiated Contact with Patient 12/23/22 1403     (approximate)   History   Laceration   HPI  Tara Grant is a 42 y.o. female with no significant past medical history presents emergency department with a laceration to the left forearm.  Patient states that she tripped and landed on a actively running chainsaw.  States that they have been cutting down a telephone pole.  Denies numbness or tingling.  Does states she has full range of motion of her wrist and fingers.  Unsure of her last Tdap.      Physical Exam   Triage Vital Signs: ED Triage Vitals  Enc Vitals Group     BP 12/23/22 1355 105/74     Pulse Rate 12/23/22 1355 86     Resp 12/23/22 1355 18     Temp 12/23/22 1355 98.7 F (37.1 C)     Temp Source 12/23/22 1355 Oral     SpO2 12/23/22 1355 95 %     Weight 12/23/22 1352 135 lb (61.2 kg)     Height 12/23/22 1352 '5\' 1"'$  (1.549 m)     Head Circumference --      Peak Flow --      Pain Score 12/23/22 1352 10     Pain Loc --      Pain Edu? --      Excl. in [place]? --     Most recent vital signs: Vitals:   12/23/22 1355  BP: 105/74  Pulse: 86  Resp: 18  Temp: 98.7 F (37.1 C)  SpO2: 95%     General: Awake, no distress.   CV:  Good peripheral perfusion. regular rate and  rhythm Resp:  Normal effort.  Abd:  No distention.   Other:  Left forearm with 3 cm laceration noted to the dorsum, no tendon involvement noted, no foreign body, full range of motion of the left wrist and fingers.  Neurovascular is intact   ED Results / Procedures / Treatments   Labs (all labs ordered are listed, but only abnormal results are displayed) Labs Reviewed - No data to display   EKG     RADIOLOGY Left forearm    PROCEDURES:   .Marland KitchenLaceration Repair  Date/Time: 12/23/2022 3:37 PM  Performed by: Versie Starks, PA-C Authorized by: Versie Starks, PA-C   Consent:    Consent obtained:   Verbal   Consent given by:  Patient   Risks, benefits, and alternatives were discussed: yes     Risks discussed:  Infection, pain, retained foreign body, tendon damage, poor cosmetic result, need for additional repair, nerve damage, poor wound healing and vascular damage   Alternatives discussed:  Delayed treatment Universal protocol:    Procedure explained and questions answered to patient or proxy's satisfaction: yes     Immediately prior to procedure, a time out was called: yes     Patient identity confirmed:  Verbally with patient Anesthesia:    Anesthesia method:  Local infiltration   Local anesthetic:  Lidocaine 2% WITH epi Laceration details:    Location:  Shoulder/arm   Shoulder/arm location:  L lower arm   Length (cm):  3 Pre-procedure details:    Preparation:  Patient was prepped and draped in usual sterile fashion and imaging obtained to evaluate for foreign bodies Exploration:    Limited defect created (wound extended): yes     Hemostasis achieved with:  Direct  pressure   Imaging obtained: x-ray     Imaging outcome: foreign body not noted     Wound extent: tendon damage     Wound extent: fascia not violated, no foreign body, no signs of injury, no nerve damage, no underlying fracture and no vascular damage     Tendon damage location:  Upper extremity   Upper extremity tendon damage location:  Forearm extensor   Tendon damage extent:  Complete transection   Tendon repair plan:  Refer for evaluation   Contaminated: yes   Treatment:    Area cleansed with:  Povidone-iodine and saline   Amount of cleaning:  Extensive   Irrigation solution:  Sterile saline   Irrigation method:  Pressure wash, syringe and tap   Debridement:  Minimal Skin repair:    Repair method:  Sutures   Suture size:  4-0   Suture material:  Nylon   Suture technique:  Simple interrupted   Number of sutures:  11 Approximation:    Approximation:  Close Repair type:    Repair type:   Intermediate Post-procedure details:    Dressing:  Non-adherent dressing and splint for protection   Procedure completion:  Tolerated well, no immediate complications    MEDICATIONS ORDERED IN ED: Medications  Tdap (BOOSTRIX) injection 0.5 mL (0.5 mLs Intramuscular Given 12/23/22 1531)  lidocaine-EPINEPHrine (XYLOCAINE W/EPI) 2 %-1:200000 (PF) injection 10 mL (10 mLs Intradermal Given 12/23/22 1409)     IMPRESSION / MDM / ASSESSMENT AND PLAN / ED COURSE  I reviewed the triage vital signs and the nursing notes.                              Differential diagnosis includes, but is not limited to, laceration, fracture, contusion, tendon injury  Patient's presentation is most consistent with acute complicated illness / injury requiring diagnostic workup.   X-ray of the left forearm Tdap  X-ray of the left forearm was independently reviewed and interpreted by me as being negative for foreign body or fracture  See procedure note for laceration repair.  In probing the depth of the wound I did notice that there was a tendon laceration.  Patient does still have full range of motion of her wrist and fingers along with elbow.  Will have her follow-up with orthopedics for reevaluation.  Tdap was updated here in the ED.  She was placed on doxycycline, Vicodin for pain as needed.  Placed in a [place] to prevent movement of the left forearm.  She is in agreement treatment plan at this time.  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  Discharged in stable condition.     FINAL CLINICAL IMPRESSION(S) / ED DIAGNOSES   Final diagnoses:  Laceration of extensor tendon of left forearm, initial encounter     Rx / DC Orders   ED Discharge Orders          Ordered    doxycycline (VIBRA-TABS) 100 MG tablet  2 times daily        12/23/22 1532    HYDROcodone-acetaminophen (NORCO/VICODIN) 5-325 MG tablet  Every 6  hours PRN        12/23/22 1532             Note:  This document was prepared using Dragon voice recognition software and may include unintentional dictation errors.    Versie Starks,  PA-C 12/23/22 1540    Naaman Plummer, MD 12/23/22 [phone]

## 2022-12-23 NOTE — ED Triage Notes (Signed)
Patient has large laceration to left forearm from chainsaw.
